# Patient Record
Sex: Female | Born: 1955 | Race: White | Hispanic: No | Marital: Married | Smoking: Former smoker
Health system: Southern US, Community
[De-identification: ages and names within clinical notes are randomized; demographics above are authoritative.]

## PROBLEM LIST (undated history)

## (undated) DIAGNOSIS — I253 Aneurysm of heart: Secondary | ICD-10-CM

## (undated) DIAGNOSIS — F410 Panic disorder [episodic paroxysmal anxiety] without agoraphobia: Secondary | ICD-10-CM

## (undated) DIAGNOSIS — G510 Bell's palsy: Secondary | ICD-10-CM

## (undated) DIAGNOSIS — K219 Gastro-esophageal reflux disease without esophagitis: Secondary | ICD-10-CM

## (undated) DIAGNOSIS — M8580 Other specified disorders of bone density and structure, unspecified site: Secondary | ICD-10-CM

## (undated) DIAGNOSIS — Z800 Family history of malignant neoplasm of digestive organs: Secondary | ICD-10-CM

## (undated) DIAGNOSIS — E785 Hyperlipidemia, unspecified: Secondary | ICD-10-CM

## (undated) DIAGNOSIS — R739 Hyperglycemia, unspecified: Secondary | ICD-10-CM

## (undated) HISTORY — DX: Hyperglycemia, unspecified: R73.9

## (undated) HISTORY — DX: Hyperlipidemia, unspecified: E78.5

## (undated) HISTORY — PX: BREAST BIOPSY: SHX20

## (undated) HISTORY — DX: Family history of malignant neoplasm of digestive organs: Z80.0

## (undated) HISTORY — DX: Panic disorder (episodic paroxysmal anxiety): F41.0

## (undated) HISTORY — DX: Other specified disorders of bone density and structure, unspecified site: M85.80

## (undated) HISTORY — DX: Bell's palsy: G51.0

## (undated) HISTORY — DX: Aneurysm of heart: I25.3

## (undated) HISTORY — DX: Gastro-esophageal reflux disease without esophagitis: K21.9

---

## 2003-08-17 ENCOUNTER — Encounter: Payer: Self-pay | Admitting: Internal Medicine

## 2004-08-25 ENCOUNTER — Ambulatory Visit: Payer: Self-pay | Admitting: Specialist

## 2004-08-31 ENCOUNTER — Ambulatory Visit: Payer: Self-pay | Admitting: Specialist

## 2004-09-13 ENCOUNTER — Ambulatory Visit: Payer: Self-pay | Admitting: Family Medicine

## 2004-10-16 ENCOUNTER — Ambulatory Visit: Payer: Self-pay | Admitting: Family Medicine

## 2004-11-14 ENCOUNTER — Other Ambulatory Visit: Payer: Self-pay

## 2004-11-17 ENCOUNTER — Ambulatory Visit: Payer: Self-pay | Admitting: Specialist

## 2004-12-19 ENCOUNTER — Ambulatory Visit: Payer: Self-pay | Admitting: Family Medicine

## 2005-01-31 ENCOUNTER — Ambulatory Visit: Payer: Self-pay | Admitting: Family Medicine

## 2005-03-07 ENCOUNTER — Ambulatory Visit: Payer: Self-pay | Admitting: Family Medicine

## 2005-04-09 ENCOUNTER — Ambulatory Visit: Payer: Self-pay | Admitting: Internal Medicine

## 2005-04-26 ENCOUNTER — Ambulatory Visit: Payer: Self-pay | Admitting: Family Medicine

## 2005-06-27 ENCOUNTER — Ambulatory Visit: Payer: Self-pay | Admitting: Family Medicine

## 2005-07-31 ENCOUNTER — Ambulatory Visit: Payer: Self-pay | Admitting: Family Medicine

## 2005-08-07 ENCOUNTER — Ambulatory Visit: Payer: Self-pay | Admitting: Family Medicine

## 2005-08-24 ENCOUNTER — Ambulatory Visit: Payer: Self-pay | Admitting: Family Medicine

## 2005-09-17 ENCOUNTER — Other Ambulatory Visit: Payer: Self-pay

## 2005-09-18 ENCOUNTER — Observation Stay: Payer: Self-pay | Admitting: Internal Medicine

## 2005-09-25 ENCOUNTER — Encounter: Payer: Self-pay | Admitting: Family Medicine

## 2005-09-25 LAB — CONVERTED CEMR LAB

## 2005-10-05 ENCOUNTER — Ambulatory Visit: Payer: Self-pay | Admitting: Family Medicine

## 2005-10-25 ENCOUNTER — Ambulatory Visit: Payer: Self-pay | Admitting: Family Medicine

## 2006-06-04 ENCOUNTER — Ambulatory Visit: Payer: Self-pay | Admitting: Family Medicine

## 2006-06-19 ENCOUNTER — Ambulatory Visit (HOSPITAL_COMMUNITY): Admission: RE | Admit: 2006-06-19 | Discharge: 2006-06-19 | Payer: Self-pay | Admitting: Urology

## 2006-07-12 ENCOUNTER — Ambulatory Visit: Payer: Self-pay | Admitting: Internal Medicine

## 2006-12-02 ENCOUNTER — Ambulatory Visit: Payer: Self-pay | Admitting: Family Medicine

## 2006-12-03 ENCOUNTER — Ambulatory Visit: Payer: Self-pay | Admitting: Family Medicine

## 2006-12-03 LAB — CONVERTED CEMR LAB
BUN: 13 mg/dL (ref 6–23)
Basophils Relative: 0.2 % (ref 0.0–1.0)
Bilirubin, Direct: 0.2 mg/dL (ref 0.0–0.3)
CO2: 31 meq/L (ref 19–32)
Eosinophils Absolute: 0.1 10*3/uL (ref 0.0–0.6)
Eosinophils Relative: 1.1 % (ref 0.0–5.0)
GFR calc Af Amer: 113 mL/min
Glucose, Bld: 107 mg/dL — ABNORMAL HIGH (ref 70–99)
HDL: 51.5 mg/dL (ref >39.0)
Hemoglobin: 14.4 g/dL (ref 12.0–15.0)
Lymphocytes Relative: 8.3 % — ABNORMAL LOW (ref 12.0–46.0)
MCV: 93.1 fL (ref 78.0–100.0)
Monocytes Absolute: 0.6 10*3/uL (ref 0.2–0.7)
Monocytes Relative: 7.1 % (ref 3.0–11.0)
Neutro Abs: 7.6 10*3/uL (ref 1.4–7.7)
Potassium: 4.2 meq/L (ref 3.5–5.1)
TSH: 0.9 microintl units/mL (ref 0.35–5.50)
Total Protein: 6.7 g/dL (ref 6.0–8.3)
VLDL: 24 mg/dL (ref 0–40)

## 2007-01-13 LAB — HM DEXA SCAN

## 2007-01-23 ENCOUNTER — Ambulatory Visit: Payer: Self-pay | Admitting: Family Medicine

## 2007-01-23 DIAGNOSIS — J309 Allergic rhinitis, unspecified: Secondary | ICD-10-CM | POA: Insufficient documentation

## 2007-01-23 DIAGNOSIS — E785 Hyperlipidemia, unspecified: Secondary | ICD-10-CM | POA: Insufficient documentation

## 2007-01-23 DIAGNOSIS — N390 Urinary tract infection, site not specified: Secondary | ICD-10-CM | POA: Insufficient documentation

## 2007-03-11 ENCOUNTER — Ambulatory Visit: Payer: Self-pay | Admitting: Internal Medicine

## 2007-04-07 ENCOUNTER — Telehealth: Payer: Self-pay | Admitting: Family Medicine

## 2007-05-20 ENCOUNTER — Ambulatory Visit: Payer: Self-pay | Admitting: Internal Medicine

## 2007-06-09 ENCOUNTER — Ambulatory Visit (HOSPITAL_COMMUNITY): Admission: RE | Admit: 2007-06-09 | Discharge: 2007-06-09 | Payer: Self-pay | Admitting: Internal Medicine

## 2007-07-25 ENCOUNTER — Ambulatory Visit: Payer: Self-pay | Admitting: Family Medicine

## 2007-08-08 ENCOUNTER — Telehealth: Payer: Self-pay | Admitting: Family Medicine

## 2007-08-25 ENCOUNTER — Encounter: Payer: Self-pay | Admitting: Family Medicine

## 2007-12-10 ENCOUNTER — Telehealth: Payer: Self-pay | Admitting: Family Medicine

## 2007-12-17 LAB — HM COLONOSCOPY: HM Colonoscopy: NORMAL

## 2007-12-24 ENCOUNTER — Ambulatory Visit: Payer: Self-pay | Admitting: Family Medicine

## 2008-01-26 ENCOUNTER — Encounter: Payer: Self-pay | Admitting: Family Medicine

## 2008-02-24 ENCOUNTER — Ambulatory Visit: Payer: Self-pay | Admitting: Family Medicine

## 2008-02-24 DIAGNOSIS — K219 Gastro-esophageal reflux disease without esophagitis: Secondary | ICD-10-CM | POA: Insufficient documentation

## 2008-04-08 DIAGNOSIS — F410 Panic disorder [episodic paroxysmal anxiety] without agoraphobia: Secondary | ICD-10-CM | POA: Insufficient documentation

## 2008-04-08 DIAGNOSIS — Z8742 Personal history of other diseases of the female genital tract: Secondary | ICD-10-CM | POA: Insufficient documentation

## 2008-04-13 ENCOUNTER — Ambulatory Visit: Payer: Self-pay | Admitting: Internal Medicine

## 2008-04-29 ENCOUNTER — Encounter: Payer: Self-pay | Admitting: Internal Medicine

## 2008-04-29 ENCOUNTER — Ambulatory Visit: Payer: Self-pay | Admitting: Internal Medicine

## 2008-05-11 ENCOUNTER — Encounter: Payer: Self-pay | Admitting: Internal Medicine

## 2008-06-29 ENCOUNTER — Encounter: Payer: Self-pay | Admitting: Cardiovascular Disease

## 2008-06-29 ENCOUNTER — Encounter: Payer: Self-pay | Admitting: Family Medicine

## 2008-07-02 ENCOUNTER — Encounter: Payer: Self-pay | Admitting: Cardiovascular Disease

## 2008-07-05 ENCOUNTER — Encounter: Payer: Self-pay | Admitting: Cardiovascular Disease

## 2008-07-13 ENCOUNTER — Encounter: Payer: Self-pay | Admitting: Family Medicine

## 2008-07-27 ENCOUNTER — Ambulatory Visit: Payer: Self-pay | Admitting: Internal Medicine

## 2008-08-10 ENCOUNTER — Ambulatory Visit: Payer: Self-pay | Admitting: Internal Medicine

## 2008-08-24 LAB — CONVERTED CEMR LAB: Pap Smear: NORMAL

## 2008-09-08 ENCOUNTER — Ambulatory Visit: Payer: Self-pay | Admitting: Family Medicine

## 2008-09-20 ENCOUNTER — Ambulatory Visit: Payer: Self-pay | Admitting: Family Medicine

## 2008-09-20 DIAGNOSIS — E559 Vitamin D deficiency, unspecified: Secondary | ICD-10-CM | POA: Insufficient documentation

## 2008-09-21 LAB — CONVERTED CEMR LAB
Albumin: 3.7 g/dL (ref 3.5–5.2)
CO2: 30 meq/L (ref 19–32)
Calcium: 9.2 mg/dL (ref 8.4–10.5)
Chloride: 104 meq/L (ref 96–112)
GFR calc non Af Amer: 111 mL/min
Potassium: 4.4 meq/L (ref 3.5–5.1)
Sodium: 141 meq/L (ref 135–145)
TSH: 0.78 microintl units/mL (ref 0.35–5.50)

## 2008-09-22 ENCOUNTER — Ambulatory Visit: Payer: Self-pay | Admitting: Family Medicine

## 2008-09-28 DIAGNOSIS — Z8632 Personal history of gestational diabetes: Secondary | ICD-10-CM | POA: Insufficient documentation

## 2008-09-28 LAB — CONVERTED CEMR LAB: Hgb A1c MFr Bld: 6.3 % — ABNORMAL HIGH (ref 4.6–6.0)

## 2008-10-04 ENCOUNTER — Encounter: Admission: RE | Admit: 2008-10-04 | Discharge: 2008-10-04 | Payer: Self-pay | Admitting: Family Medicine

## 2008-11-25 ENCOUNTER — Ambulatory Visit: Payer: Self-pay | Admitting: Unknown Physician Specialty

## 2009-04-25 ENCOUNTER — Ambulatory Visit: Payer: Self-pay | Admitting: Family Medicine

## 2009-04-26 LAB — CONVERTED CEMR LAB
BUN: 15 mg/dL (ref 6–23)
CO2: 32 meq/L (ref 19–32)
Chloride: 108 meq/L (ref 96–112)
Cholesterol: 155 mg/dL (ref 0–200)
Hgb A1c MFr Bld: 5.9 % (ref 4.6–6.5)
Total CHOL/HDL Ratio: 3
Triglycerides: 64 mg/dL (ref 0.0–149.0)

## 2009-07-06 ENCOUNTER — Telehealth: Payer: Self-pay | Admitting: Family Medicine

## 2009-07-22 ENCOUNTER — Encounter (INDEPENDENT_AMBULATORY_CARE_PROVIDER_SITE_OTHER): Payer: Self-pay | Admitting: *Deleted

## 2009-10-25 ENCOUNTER — Encounter (INDEPENDENT_AMBULATORY_CARE_PROVIDER_SITE_OTHER): Payer: Self-pay | Admitting: *Deleted

## 2009-11-01 ENCOUNTER — Ambulatory Visit: Payer: Self-pay | Admitting: Family Medicine

## 2009-11-03 LAB — CONVERTED CEMR LAB
CO2: 31 meq/L (ref 19–32)
Calcium: 9.2 mg/dL (ref 8.4–10.5)
Cholesterol: 139 mg/dL (ref 0–200)
Creatinine, Ser: 0.7 mg/dL (ref 0.4–1.2)
Creatinine,U: 104.4 mg/dL
Hgb A1c MFr Bld: 5.9 % (ref 4.6–6.5)
LDL Cholesterol: 70 mg/dL (ref 0–99)
Microalb Creat Ratio: 13.4 mg/g (ref 0.0–30.0)
Microalb, Ur: 1.4 mg/dL (ref 0.0–1.9)
Potassium: 4.2 meq/L (ref 3.5–5.1)
Sodium: 143 meq/L (ref 135–145)
Triglycerides: 85 mg/dL (ref 0.0–149.0)

## 2009-12-16 LAB — HM PAP SMEAR: HM Pap smear: NORMAL

## 2010-01-02 ENCOUNTER — Telehealth: Payer: Self-pay | Admitting: Family Medicine

## 2010-01-05 ENCOUNTER — Ambulatory Visit: Payer: Self-pay | Admitting: Family Medicine

## 2010-01-05 LAB — CONVERTED CEMR LAB
Bilirubin Urine: NEGATIVE
Specific Gravity, Urine: 1.025
Urobilinogen, UA: 0.2
pH: 7

## 2010-01-12 LAB — HM DEXA SCAN

## 2010-05-01 ENCOUNTER — Encounter: Payer: Self-pay | Admitting: Family Medicine

## 2010-05-02 ENCOUNTER — Ambulatory Visit: Payer: Self-pay | Admitting: Family Medicine

## 2010-05-04 LAB — CONVERTED CEMR LAB
AST: 22 units/L (ref 0–37)
Albumin: 4.1 g/dL (ref 3.5–5.2)
Alkaline Phosphatase: 65 units/L (ref 39–117)
BUN: 17 mg/dL (ref 6–23)
Calcium: 9.3 mg/dL (ref 8.4–10.5)
Direct LDL: 148.6 mg/dL
Eosinophils Relative: 2.9 % (ref 0.0–5.0)
GFR calc non Af Amer: 82.24 mL/min (ref >60)
HCT: 41 % (ref 36.0–46.0)
HDL: 52.1 mg/dL (ref >39.00)
Hemoglobin: 13.9 g/dL (ref 12.0–15.0)
Hgb A1c MFr Bld: 6 % (ref 4.6–6.5)
Lymphocytes Relative: 31.2 % (ref 12.0–46.0)
Lymphs Abs: 1.8 10*3/uL (ref 0.7–4.0)
Monocytes Relative: 8 % (ref 3.0–12.0)
Platelets: 306 10*3/uL (ref 150.0–400.0)
Potassium: 4.2 meq/L (ref 3.5–5.1)
Sodium: 139 meq/L (ref 135–145)
Total Bilirubin: 0.7 mg/dL (ref 0.3–1.2)
Total Protein: 6.5 g/dL (ref 6.0–8.3)
VLDL: 18.4 mg/dL (ref 0.0–40.0)
WBC: 5.7 10*3/uL (ref 4.5–10.5)

## 2010-05-08 ENCOUNTER — Ambulatory Visit: Payer: Self-pay | Admitting: Family Medicine

## 2010-08-15 ENCOUNTER — Ambulatory Visit: Payer: Self-pay | Admitting: Family Medicine

## 2010-08-15 LAB — CONVERTED CEMR LAB
ALT: 15 units/L (ref 0–35)
AST: 22 units/L (ref 0–37)
Direct LDL: 135.5 mg/dL

## 2010-08-24 ENCOUNTER — Ambulatory Visit: Payer: Self-pay | Admitting: Family Medicine

## 2010-09-13 ENCOUNTER — Telehealth: Payer: Self-pay | Admitting: Family Medicine

## 2010-10-12 ENCOUNTER — Ambulatory Visit
Admission: RE | Admit: 2010-10-12 | Discharge: 2010-10-12 | Payer: Self-pay | Source: Home / Self Care | Attending: Family Medicine | Admitting: Family Medicine

## 2010-10-12 ENCOUNTER — Other Ambulatory Visit: Payer: Self-pay | Admitting: Family Medicine

## 2010-10-12 ENCOUNTER — Encounter: Payer: Self-pay | Admitting: Family Medicine

## 2010-10-12 DIAGNOSIS — R109 Unspecified abdominal pain: Secondary | ICD-10-CM | POA: Insufficient documentation

## 2010-10-12 DIAGNOSIS — Z87442 Personal history of urinary calculi: Secondary | ICD-10-CM | POA: Insufficient documentation

## 2010-10-12 DIAGNOSIS — R1084 Generalized abdominal pain: Secondary | ICD-10-CM | POA: Insufficient documentation

## 2010-10-12 LAB — CONVERTED CEMR LAB
Casts: 0 /lpf
Glucose, Urine, Semiquant: NEGATIVE
Specific Gravity, Urine: <1.005
Yeast, UA: 0
pH: 7.5

## 2010-10-13 ENCOUNTER — Ambulatory Visit: Payer: Self-pay | Admitting: Cardiology

## 2010-10-16 ENCOUNTER — Ambulatory Visit: Admission: RE | Admit: 2010-10-16 | Payer: Self-pay | Source: Home / Self Care | Admitting: Family Medicine

## 2010-10-16 LAB — RENAL FUNCTION PANEL
BUN: 21 mg/dL (ref 6–23)
CO2: 31 mEq/L (ref 19–32)
Creatinine, Ser: 0.8 mg/dL (ref 0.4–1.2)
GFR: 80.89 mL/min (ref >60.00)
Glucose, Bld: 85 mg/dL (ref 70–99)
Phosphorus: 3.7 mg/dL (ref 2.3–4.6)
Sodium: 138 mEq/L (ref 135–145)

## 2010-10-16 LAB — CBC WITH DIFFERENTIAL/PLATELET
Basophils Absolute: 0 10*3/uL (ref 0.0–0.1)
Eosinophils Absolute: 0.1 10*3/uL (ref 0.0–0.7)
HCT: 42 % (ref 36.0–46.0)
Hemoglobin: 14.2 g/dL (ref 12.0–15.0)
Lymphs Abs: 1.5 10*3/uL (ref 0.7–4.0)
MCHC: 33.8 g/dL (ref 30.0–36.0)
MCV: 95.6 fl (ref 78.0–100.0)
Monocytes Absolute: 0.4 10*3/uL (ref 0.1–1.0)
Neutro Abs: 4.1 10*3/uL (ref 1.4–7.7)
Platelets: 336 10*3/uL (ref 150.0–400.0)
RDW: 12.6 % (ref 11.5–14.6)

## 2010-10-16 LAB — HEPATIC FUNCTION PANEL
ALT: 15 U/L (ref 0–35)
Albumin: 4.1 g/dL (ref 3.5–5.2)
Bilirubin, Direct: 0.1 mg/dL (ref 0.0–0.3)
Total Protein: 6.5 g/dL (ref 6.0–8.3)

## 2010-10-16 LAB — AMYLASE: Amylase: 93 U/L (ref 27–131)

## 2010-10-24 NOTE — Letter (Signed)
Summary: [facility]  [facility]   Imported By: Maryln Gottron 05/05/2010 15:49:25  _____________________________________________________________________  External Attachment:    Type:   Image     Comment:   External Document

## 2010-10-24 NOTE — Assessment & Plan Note (Signed)
Summary: RO 30 MINS CYD   Vital Signs:  Patient profile:   55 year old female Weight:      126.50 pounds BMI:     21.13 Temp:     97.9 degrees F oral Pulse rate:   84 / minute Pulse rhythm:   regular BP sitting:   100 / 60  (left arm) Cuff size:   regular  Vitals Entered By: Sydell Axon LPN (May 08, 2010 9:34 AM) CC: Follow-up on lab work   History of Present Illness: here for f/u of lipids, hyperglycemia   is having a good summer   wt is down 1 lb-- wt is good  is still exercising - is walking and using exercise ball    bp is good 100/60  lipids are high with LDL 148 off of the lipitor (prev 70) she just wanted to stop it and see what happens  did not have side effects or problems  is eating fairly well - but slacked off a bit and wants to try to be better   hyperglycemia - fasting sugar 92 and AIC up to 6.0 was 5.8    Allergies: 1)  ! Sulfadiazine (Sulfadiazine)  Past History:  Past Medical History: Last updated: 04/25/2009 atrial septal aneurysm, patent foramen ovale frequent utis  DIABETES MELLITUS, GESTATIONAL, HX OF (ICD-V13.29) PANIC DISORDER (ICD-300.01) GERD (ICD-530.81) COLORECTAL CANCER, FAMILY HX (ICD-V16.0) FACIAL PARESTHESIA (ICD-782.0) BACK PAIN (ICD-724.5) * TONSILLAR DEBRis ALLERGIC RHINITIS (ICD-477.9) URINARY TRACT INFECTION, RECURRENT (ICD-599.0) HYPERLIPIDEMIA (ICD-272.4) osteopenia hyperglycemia (pt has hx of gestational DM)  GYN-- [facility] -- Mariah Milling  Past Surgical History: Last updated: 10/06/2008 Geatational  diabetes Dexa- osteopenia 10/05 Colonoscopy negative next 5 years 2004 (11/09) colonoscopy negative - re check 5 y stress test 4/09 abd [place] normal (01/10)  c-section x 2 3 breast biopsies  Family History: Last updated: 04/25/2009 cousin -- colon cancer  parents - diabetes   Family History of Colon Cancer: Father,cousin Family History of Pancreatic Cancer: Mother Family History of Heart Disease:  Father Family History of Diabetes: Mother,Father  Social History: Last updated: 04/08/2008 Former Smoker-stopped after college Occupation: Retired from teaching Alcohol Use - no Illicit Drug Use - no  Risk Factors: Smoking Status: quit (01/23/2007)  Review of Systems General:  Denies fatigue, loss of appetite, and malaise. Eyes:  Denies blurring and eye irritation. CV:  Denies chest pain or discomfort and palpitations. Resp:  Denies cough, shortness of breath, and wheezing. GI:  Denies abdominal pain, change in bowel habits, and indigestion. GU:  Denies dysuria and urinary frequency. MS:  Denies muscle aches and cramps. Derm:  Denies itching, poor wound healing, and rash. Neuro:  Denies numbness and tingling. Endo:  Denies cold intolerance, excessive thirst, excessive urination, and heat intolerance. Heme:  Denies abnormal bruising and bleeding.  Physical Exam  General:  alert, well-developed, well-nourished, and well-hydrated.   Head:  normocephalic, atraumatic, and no abnormalities observed.   Eyes:  vision grossly intact, pupils equal, pupils round, and pupils reactive to light.  no conjunctival pallor, injection or icterus  Mouth:  MMM Neck:  supple with full rom and no masses or thyromegally, no JVD or carotid bruit  Lungs:  Normal respiratory effort, chest expands symmetrically. Lungs are clear to auscultation, no crackles or wheezes. Heart:  2/6 systolic murmur , RRR  Abdomen:  Bowel sounds positive,abdomen soft and non-tender without masses, organomegaly or hernias noted. no renal bruits  Msk:  No deformity or scoliosis noted of thoracic or lumbar  spine.  no acute joint changes  Pulses:  R and L carotid,radial,femoral,dorsalis pedis and posterior tibial pulses are full and equal bilaterally Extremities:  No clubbing, cyanosis, edema, or deformity noted with normal full range of motion of all joints.   Neurologic:  sensation intact to light touch, gait normal, and DTRs  symmetrical and normal.   Skin:  Intact without suspicious lesions or rashes Cervical Nodes:  No lymphadenopathy noted Inguinal Nodes:  No significant adenopathy Psych:  normal affect, talkative and pleasant    Impression & Recommendations:  Problem # 1:  HYPERGLYCEMIA (ICD-790.29) Assessment Deteriorated  with AIc of 6.0-- higher than prev disc healthy diet (low simple sugar/ choose complex carbs/ low sat fat) diet and exercise in detail  long disc about low glycemic index diet  ? 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 One by mouth daily 2)  Aspirin 81 Mg Tabs (Aspirin) .... Take one by mouth daily 3)  Multivitamins Tabs (Multiple vitamin) .... Take one by mouth daily 4)  Vitamin D 50,0000 Iu --------(from [place])  .Marland Kitchen.. 1 by mouth weekly 5)  Valtrex 1 Gm Tabs (Valacyclovir hcl) .... 2 by mouth two times a day for one day for cold sores  Patient Instructions: 1)  you can raise your HDL (good cholesterol) by increasing exercise and eating omega 3 fatty acid supplement like fish oil or flax seed oil over the counter 2)  you can lower LDL (bad cholesterol) by limiting saturated fats in diet like red meat, fried foods, egg yolks, fatty breakfast meats, high fat dairy products and shellfish  3)  also watch your sugar - keep carbs complex with 2-3 oz servings 4)  keep up the exercise 5)  schedule fasting labs and then f/u in 3 months  lipid/ast/alt/AIC 272, hyperglycemia   Current Allergies (reviewed today): ! SULFADIAZINE (SULFADIAZINE)   Preventive Care Screening  Mammogram:    Date:  12/23/2009    Results:  normal   Pap Smear:    Date:  12/23/2009    Results:  normal   Appended Document: F/U AFTER LABS / LFW r/s from 11/29 please let pt know that I did some research on pancreatic cancer in families -- and there may be a mild genetic component - I know she is worried because she has a family hx if she wants further eval - the literature recommends spiral CT of the area  if she is interested in this ask her to call insurance to make sure that would be covered for screening of panc ca with family hx -- then let me know and I will schedule test   Appended Document: F/U AFTER LABS / LFW r/s from 11/29 called [phone] got no answer and no v/m will try another time.

## 2010-10-24 NOTE — Assessment & Plan Note (Signed)
Summary: ?UTI/CLE   Vital Signs:  Patient profile:   55 year old female Weight:      127.75 pounds Temp:     98.1 degrees F oral Pulse rate:   68 / minute Pulse rhythm:   regular BP sitting:   120 / 78  (left arm) Cuff size:   regular  Vitals Entered By: Sydell Axon LPN (January 05, 2010 2:25 PM) CC: ? UTI, urine frequency and urgency   History of Present Illness: Pt here for frequent urination which has been going on for one week, has strong odor and alsways has sens of urgency. She has not had fever or chills, no back pain.   Problems Prior to Update: 1)  Hyperglycemia  (ICD-790.29) 2)  Neoplasm, Malignant, Pancreas, Family Hx  (ICD-V16.0) 3)  Unspecified Vitamin D Deficiency  (ICD-268.9) 4)  Diabetes Mellitus, Gestational, Hx of  (ICD-V13.29) 5)  Panic Disorder  (ICD-300.01) 6)  Gerd  (ICD-530.81) 7)  Colorectal Cancer, Family Hx  (ICD-V16.0) 8)  Facial Paresthesia  (ICD-782.0) 9)  Back Pain  (ICD-724.5) 10)  Tonsillar Debris  () 11)  Allergic Rhinitis  (ICD-477.9) 12)  Urinary Tract Infection, Recurrent  (ICD-599.0) 13)  Hyperlipidemia  (ICD-272.4)  Medications Prior to Update: 1)  Citracal/vitamin D 250-200 Mg-Unit Tabs (Calcium Citrate-Vitamin D) .... One By Mouth Daily 2)  Aspirin 81 Mg Tabs (Aspirin) .... Take One By Mouth Daily 3)  Multivitamins  Tabs (Multiple Vitamin) .... Take One By Mouth Daily 4)  Vitamin D 50,0000 Iu  --------(From [facility]) .Marland Kitchen.. 1 By Mouth Weekly 5)  Valtrex 1 Gm Tabs (Valacyclovir Hcl) .... 2 By Mouth Two Times A Day For One Day For Cold Sores  Allergies: 1)  ! Sulfadiazine (Sulfadiazine)  Physical Exam  General:  alert, well-developed, well-nourished, and well-hydrated.   Head:  normocephalic, atraumatic, and no abnormalities observed.   Eyes:  vision grossly intact, pupils equal, pupils round, and pupils reactive to light.  no conjunctival pallor, injection or icterus  Abdomen:  No suprapubic tenderness Msk:  No CVAT   Impression &  Recommendations:  Problem # 1:  URINARY TRACT INFECTION, RECURRENT (ICD-599.0) Assessment Unchanged  Many bacteria seen, minimal WBCs seen. Will treat and culture. Her updated medication list for this problem includes:    Macrobid 100 Mg Caps (Nitrofurantoin monohyd macro) ..... One tab by mouth two times a day 14  Orders: T-Culture, Urine ([id number])  Complete Medication List: 1)  Citracal/vitamin D 250-200 Mg-unit Tabs (Calcium citrate-vitamin d) .... One by mouth daily 2)  Aspirin 81 Mg Tabs (Aspirin) .... Take one by mouth daily 3)  Multivitamins Tabs (Multiple vitamin) .... Take one by mouth daily 4)  Vitamin D 50,0000 Iu --------(from [place])  .Marland Kitchen.. 1 by mouth weekly 5)  Valtrex 1 Gm Tabs (Valacyclovir hcl) .... 2 by mouth two times a day for one day for cold sores 6)  Macrobid 100 Mg Caps (Nitrofurantoin monohyd macro) .... One tab by mouth two times a day 14  Other Orders: UA Dipstick W/ Micro (manual) ([id number])  Patient Instructions: 1)  Will call with culture results. Prescriptions: MACROBID 100 MG CAPS (NITROFURANTOIN MONOHYD MACRO) one tab by mouth two times a day 14  #14 x 0   Entered and Authorized by:   Shaune Leeks MD   Signed by:   Shaune Leeks MD on 01/05/2010   Method used:   Electronically to        [facility] [phone]* (retail)  [address]       [place], [place]  [address]       Ph: [phone] or [phone]       Fax: [phone]   RxID:   [id number]   Current Allergies (reviewed today): ! SULFADIAZINE (SULFADIAZINE)  Laboratory Results   Urine Tests  Date/Time Received: January 05, 2010 2:35 PM  Date/Time Reported: January 05, 2010 2:35 PM   Routine Urinalysis   Color: lt. yellow Appearance: Cloudy Glucose: negative   (Normal Range: Negative) Bilirubin: negative   (Normal Range: Negative) Ketone: negative   (Normal Range: Negative) Spec. Gravity: 1.025   (Normal Range: 1.003-1.035) Blood:  negative   (Normal Range: Negative) pH: 7.0   (Normal Range: 5.0-8.0) Protein: trace   (Normal Range: Negative) Urobilinogen: 0.2   (Normal Range: 0-1) Nitrite: negative   (Normal Range: Negative) Leukocyte Esterace: moderate   (Normal Range: Negative)  Urine Microscopic WBC/HPF: 0-1 RBC/HPF: 0-1 Bacteria/HPF: 4+ Epithelial/HPF: Rare        Appended Document: ?UTI/CLE

## 2010-10-24 NOTE — Letter (Signed)
Summary: [place] No Show Letter  [place] [facility]  [address] [place], [place] [address]   Phone: [phone]  Fax: [phone]    10/25/2009 MRN: [id number]  [facility] [address] [place], [place]  [address]   Dear Ms. [facility],   Our records indicate that you missed your scheduled appointment with ____lab_________________ on __1.31.11__________.  Please contact this office to reschedule your appointment as soon as possible.  It is important that you keep your scheduled appointments with your physician, so we can provide you the best care possible.  Please be advised that there may be a charge for "no show" appointments.    Sincerely,   [place] at [facility]

## 2010-10-24 NOTE — Progress Notes (Signed)
Summary: Wants to stop Lipitor  Phone Note Call from Patient Call back at Home Phone [phone]   Caller: Patient Call For: Judith Part MD Summary of Call: Patient came in the office today and wanted to let Dr. Milinda Antis know that she wants to stop taking Lipitor.  She says that she knows she is not suppose to stop taking it suddenly but wants to "wean off" of it.  She has an appt for labs in August followed by an appt with Dr. Milinda Antis.  Please advise.  [facility]. Initial call taken by: Linde Gillis CMA Duncan Dull),  January 02, 2010 3:00 PM  Follow-up for Phone Call        does not have to wean off - just stop it  let me know why she is stopping it please we will disc in more detail in june  eat low sat fat diet  I will remove it from med list  Follow-up by: Judith Part MD,  January 02, 2010 3:07 PM  Additional Follow-up for Phone Call Additional follow up Details #1::        Patient advised.Consuello Masse CMA   Patient is going to stop medication Additional Follow-up by: Benny Lennert CMA Duncan Dull),  January 02, 2010 4:44 PM

## 2010-10-26 NOTE — Assessment & Plan Note (Signed)
Summary: F/U AFTER LABS / LFW r/s from 11/29   Vital Signs:  Patient profile:   55 year old female Height:      65 inches Weight:      116.50 pounds BMI:     19.46 Temp:     97.7 degrees F oral Pulse rate:   76 / minute Pulse rhythm:   regular BP sitting:   118 / 72  (left arm) Cuff size:   regular  Vitals Entered By: Lewanda Rife LPN (August 24, 2010 8:11 AM) CC: three month f/u after labs   History of Present Illness: here for 3 mo f/u for hyperglycemia and hyperlipidemia   pt had gest DM in past   lost 10 lb since last visit  is still walking about the same amount  cut out all dessert type sweets  does eat some whole wheat pasta -- otherwise not a big carb eater   bp great at 118/72  AIc is 6.1 - overall stable from last check   (did have a week vacation before [facility] )    lipids are down some with trig 68 and HDL 53 and LDL 135.5 (down from 148) is making progress  has given dessert  has given up fried foods  does not eat beef  some ice cream occas-- trying to stay away (usually frozen yogurt)  no bkfast meat or egg yolks as a rule  did eat a lot of shrimp on vacation   DM and chol in family-- father had CAD   is worried about pancreatic cancer because mother had it  had nl [place] in 2010     Allergies: 1)  ! Sulfadiazine (Sulfadiazine)  Past History:  Past Medical History: Last updated: 04/25/2009 atrial septal aneurysm, patent foramen ovale frequent utis  DIABETES MELLITUS, GESTATIONAL, HX OF (ICD-V13.29) PANIC DISORDER (ICD-300.01) GERD (ICD-530.81) COLORECTAL CANCER, FAMILY HX (ICD-V16.0) FACIAL PARESTHESIA (ICD-782.0) BACK PAIN (ICD-724.5) * TONSILLAR DEBRis ALLERGIC RHINITIS (ICD-477.9) URINARY TRACT INFECTION, RECURRENT (ICD-599.0) HYPERLIPIDEMIA (ICD-272.4) osteopenia hyperglycemia (pt has hx of gestational DM)  GYN-- [facility] -- Mariah Milling  Past Surgical History: Last updated: 10/06/2008 Geatational  diabetes Dexa-  osteopenia 10/05 Colonoscopy negative next 5 years 2004 (11/09) colonoscopy negative - re check 5 y stress test 4/09 abd [place] normal (01/10)  c-section x 2 3 breast biopsies  Family History: Last updated: 04/25/2009 cousin -- colon cancer  parents - diabetes   Family History of Colon Cancer: Father,cousin Family History of Pancreatic Cancer: Mother Family History of Heart Disease: Father Family History of Diabetes: Mother,Father  Social History: Last updated: 04/08/2008 Former Smoker-stopped after college Occupation: Retired from teaching Alcohol Use - no Illicit Drug Use - no  Risk Factors: Smoking Status: quit (01/23/2007)  Review of Systems General:  Denies fatigue, loss of appetite, and malaise. Eyes:  Denies blurring and eye pain. CV:  Denies palpitations and shortness of breath with exertion. Resp:  Denies cough, shortness of breath, and wheezing. GI:  Denies abdominal pain, change in bowel habits, and indigestion. GU:  Denies dysuria and urinary frequency. MS:  Denies joint pain, muscle aches, and cramps. Derm:  Denies itching, lesion(s), poor wound healing, and rash. Neuro:  Denies numbness and weakness. Psych:  Denies anxiety and depression. Endo:  Denies excessive thirst and excessive urination. Heme:  Denies abnormal bruising and bleeding.  Physical Exam  General:  slim and well appearing  Head:  normocephalic, atraumatic, and no abnormalities observed.   Eyes:  vision grossly intact,  pupils equal, pupils round, and pupils reactive to light.  no conjunctival pallor, injection or icterus  Mouth:  pharynx pink and moist.   Neck:  supple with full rom and no masses or thyromegally, no JVD or carotid bruit  Lungs:  Normal respiratory effort, chest expands symmetrically. Lungs are clear to auscultation, no crackles or wheezes. Heart:  2/6 systolic murmur , RRR  Abdomen:  Bowel sounds positive,abdomen soft and non-tender without masses, organomegaly or hernias  noted. no renal bruits  Msk:  No deformity or scoliosis noted of thoracic or lumbar spine.   Pulses:  R and L carotid,radial,femoral,dorsalis pedis and posterior tibial pulses are full and equal bilaterally Extremities:  No clubbing, cyanosis, edema, or deformity noted with normal full range of motion of all joints.   Neurologic:  sensation intact to light touch, gait normal, and DTRs symmetrical and normal.   Skin:  Intact without suspicious lesions or rashes Cervical Nodes:  No lymphadenopathy noted Psych:  normal affect, talkative and pleasant    Impression & Recommendations:  Problem # 1:  HYPERGLYCEMIA (ICD-790.29) Assessment Unchanged  pt has stable AIC -- with low sugar diet and wt loss she is interested in lifestyle center - perhaps later after tax season commended on good work re check in 6 mo and f/u  Labs Reviewed: Creat: 0.8 (05/02/2010)     Last Eye Exam: normal (04/23/2009)  Problem # 2:  HYPERLIPIDEMIA (ICD-272.4) Assessment: Improved  with some improvement with low sat fat diet reviewed this again in detail  goal LDL would be 100 (though under 130 is acceptible) disc family hx  re check 6 mo and f/u  Labs Reviewed: SGOT: 22 (08/15/2010)   SGPT: 15 (08/15/2010)   HDL:53.90 (08/15/2010), 52.10 (05/02/2010)  LDL:70 (11/01/2009), 87 (04/25/2009)  Chol:213 (08/15/2010), 212 (05/02/2010)  Trig:68.0 (08/15/2010), 92.0 (05/02/2010)  Problem # 3:  NEOPLASM, MALIGNANT, PANCREAS, FAMILY HX (ICD-V16.0) pt is worried about this in light of mother's hx/ her own hyperglycemia  will look into it in more detail  she had nl [place] in 2010 which is re- assuring  pt is asking about mri  Complete Medication List: 1)  Citracal/vitamin D 250-200 Mg-unit Tabs (Calcium citrate-vitamin d) .... One by mouth daily 2)  Aspirin 81 Mg Tabs (Aspirin) .... Take one by mouth daily 3)  Multivitamins Tabs (Multiple vitamin) .... Take one by mouth daily 4)  Valtrex 1 Gm Tabs (Valacyclovir hcl)  .... 2 by mouth two times a day for one day for cold sores as needed. 5)  Vitamin D 1000 Unit Tabs (Cholecalciferol) .... Take 1 tablet by mouth once a day  Patient Instructions: 1)  you can raise your HDL (good cholesterol) by increasing exercise and eating omega 3 fatty acid supplement like fish oil or flax seed oil over the counter 2)  you can lower LDL (bad cholesterol) by limiting saturated fats in diet like red meat, fried foods, egg yolks, fatty breakfast meats, high fat dairy products and shellfish  3)  schedule fasting labs in 6 months lipid/ast/alt 272 and AIc , renal , hepatic for hyperglycemia   4)  then follow up    Orders Added: 1)  Est. Patient Level IV [95621]    Current Allergies (reviewed today): ! SULFADIAZINE (SULFADIAZINE)  Appended Document: F/U AFTER LABS / LFW r/s from 11/29 please let pt know that I did some research on pancreatic cancer in families -- and there may be a mild genetic component - I know she is  worried because she has a family hx if she wants further eval - the literature recommends spiral CT of the area  if she is interested in this ask her to call insurance to make sure that would be covered for screening of panc ca with family hx -- then let me know and I will schedule test   Appended Document: F/U AFTER LABS / LFW r/s from 11/29 called [phone] got no answer and no v/m will try another time.  Appended Document: F/U AFTER LABS / LFW r/s from 11/29 Patient notified as instructed by telephone. Pt said she would ck and let Dr Milinda Antis know.

## 2010-10-26 NOTE — Progress Notes (Signed)
Summary: wants spiral CT  Phone Note Call from Patient Call back at Home Phone [phone]   Caller: Patient Call For: [name] Conway Summary of Call: Pt has decided to have spiral CT.  She prefers to go to [facility].  She is out of town until 12/26. Initial call taken by: Lowella Petties CMA, AAMA,  September 13, 2010 10:42 AM  Follow-up for Phone Call        I will go ahead and order for [name] last renal profile was summer- let me know if we need to re order bun/ cr for contrast -thanks Follow-up by: [name] Conway,  September 13, 2010 1:15 PM  Additional Follow-up for Phone Call Additional follow up Details #1::        Called patient to schedule the CT. Patient did not call her insurance co so I recommended that she do this before she has it done as it likely wil not be Authorized by [employer]. she will call me back on 09/19/2010 after  she calls her insurance company. will wait to hear from her . Additional Follow-up by: Carlton Adam,  September 13, 2010 2:38 PM    Additional Follow-up for Phone Call Additional follow up Details #2::    Called patient again about the CT. She does not want to have it done as it will not be covered under her insurance plan. I will cancel the order .  Follow-up by: Carlton Adam,  September 28, 2010 4:26 PM

## 2010-10-26 NOTE — Assessment & Plan Note (Signed)
Summary: LOWER ABD,BACK PAIN/CLE   Vital Signs:  Patient profile:   55 year old female Weight:      115.75 pounds BMI:     19.33 Temp:     97.7 degrees F oral Pulse rate:   76 / minute Pulse rhythm:   regular BP sitting:   110 / 64  (left arm) Cuff size:   regular  Vitals Entered By: Selena Batten Dance CMA Duncan Dull) (October 12, 2010 11:54 AM) CC: Abd and back pain x 2weeks   History of Present Illness: 2 weeks of symptoms   is having pain over L outer hip - radiates to low abd  some nausea also  woke her up in the middle of the night   is dull in general -- not doubling her over no urine symptoms  no frequency or blood or burning  no fever   has been nauseated on and off   no vaginal d/c or irritation of bleeding   is worrried about a kidney stone  does have a fam hx of panc cancer  this worries her as she has also lost some wt      Allergies: 1)  ! Sulfadiazine (Sulfadiazine)  Past History:  Past Medical History: Last updated: 04/25/2009 atrial septal aneurysm, patent foramen ovale frequent utis  DIABETES MELLITUS, GESTATIONAL, HX OF (ICD-V13.29) PANIC DISORDER (ICD-300.01) GERD (ICD-530.81) COLORECTAL CANCER, FAMILY HX (ICD-V16.0) FACIAL PARESTHESIA (ICD-782.0) BACK PAIN (ICD-724.5) * TONSILLAR DEBRis ALLERGIC RHINITIS (ICD-477.9) URINARY TRACT INFECTION, RECURRENT (ICD-599.0) HYPERLIPIDEMIA (ICD-272.4) osteopenia hyperglycemia (pt has hx of gestational DM)  GYN-- [facility] -- Mariah Milling  Past Surgical History: Last updated: 10/06/2008 Geatational  diabetes Dexa- osteopenia 10/05 Colonoscopy negative next 5 years 2004 (11/09) colonoscopy negative - re check 5 y stress test 4/09 abd [place] normal (01/10)  c-section x 2 3 breast biopsies  Family History: Last updated: 04/25/2009 cousin -- colon cancer  parents - diabetes   Family History of Colon Cancer: Father,cousin Family History of Pancreatic Cancer: Mother Family History of Heart Disease:  Father Family History of Diabetes: Mother,Father  Social History: Last updated: 04/08/2008 Former Smoker-stopped after college Occupation: Retired from teaching Alcohol Use - no Illicit Drug Use - no  Risk Factors: Smoking Status: quit (01/23/2007)  Review of Systems General:  Complains of weight loss; denies fatigue, loss of appetite, and malaise. Eyes:  Denies blurring and eye irritation. CV:  Denies chest pain or discomfort, lightheadness, and palpitations. Resp:  Denies cough, shortness of breath, and wheezing. GI:  Complains of nausea; denies abdominal pain, bloody stools, change in bowel habits, and indigestion. GU:  Denies dysuria, hematuria, and urinary frequency. MS:  Complains of low back pain; denies muscle weakness. Derm:  Denies itching, lesion(s), poor wound healing, and rash. Neuro:  Denies numbness and tingling. Psych:  Denies anxiety and depression. Endo:  Denies cold intolerance, excessive thirst, excessive urination, and heat intolerance. Heme:  Denies abnormal bruising and bleeding.  Physical Exam  General:  slim and well appearing  Head:  normocephalic, atraumatic, and no abnormalities observed.   Eyes:  vision grossly intact, pupils equal, pupils round, and pupils reactive to light.  no conjunctival pallor, injection or icterus  Mouth:  pharynx pink and moist.   Neck:  supple with full rom and no masses or thyromegally, no JVD or carotid bruit  Chest Wall:  No deformities, masses, or tenderness noted. Lungs:  Normal respiratory effort, chest expands symmetrically. Lungs are clear to auscultation, no crackles or wheezes. Heart:  2/6  systolic murmur , RRR  Abdomen:  L flank tenderness some suprapubic tenderness no cva tenderness  no rebound or gaurding  no distention, no masses, no hepatomegaly, and no splenomegaly.   Msk:  no CVA tenderness  no spinal tenderness no trochanteric tenderness neg slr  nl rom hips  nl gait  Pulses:  R and L  carotid,radial,femoral,dorsalis pedis and posterior tibial pulses are full and equal bilaterally Extremities:  No clubbing, cyanosis, edema, or deformity noted with normal full range of motion of all joints.   Neurologic:  sensation intact to light touch, gait normal, and DTRs symmetrical and normal.   Skin:  Intact without suspicious lesions or rashes Cervical Nodes:  No lymphadenopathy noted Inguinal Nodes:  No significant adenopathy Psych:  normal affect, talkative and pleasant    Impression & Recommendations:  Problem # 1:  FLANK PAIN, LEFT (ICD-789.09) Assessment New with abd pain and nausea - also pos ua and hx of kidney stone on L  labs and spiral CT urine cx tx for uti with cipro and update  Her updated medication list for this problem includes:    Aspirin 81 Mg Tabs (Aspirin) .Marland Kitchen... Take one by mouth daily  Orders: UA Dipstick W/ Micro (manual) (22025) Venipuncture (42706) TLB-Renal Function Panel (80069-RENAL) TLB-CBC Platelet - w/Differential (85025-CBCD) TLB-Amylase (82150-AMYL) TLB-Lipase (83690-LIPASE) TLB-Hepatic/Liver Function Pnl (80076-HEPATIC) T-Culture, Urine (23762-83151) Radiology Referral (Radiology)  Problem # 2:  NEPHROLITHIASIS, HX OF (ICD-V13.01) Assessment: New has hx of kidney stone on L that correlates with pain  rev urol note spiral CT ordered  Orders: UA Dipstick W/ Micro (manual) (76160) Venipuncture (73710) TLB-Renal Function Panel (80069-RENAL) TLB-CBC Platelet - w/Differential (85025-CBCD) TLB-Amylase (82150-AMYL) TLB-Lipase (83690-LIPASE) TLB-Hepatic/Liver Function Pnl (80076-HEPATIC) T-Culture, Urine (62694-85462) Radiology Referral (Radiology)  Problem # 3:  ABDOMINAL PAIN, GENERALIZED (ICD-789.07) Assessment: New see above  with nausea - also some wt loss  also fam hx of panc cancer labs  ct and adv Orders: UA Dipstick W/ Micro (manual) (70350) Venipuncture (09381) TLB-Renal Function Panel (80069-RENAL) TLB-CBC Platelet  - w/Differential (85025-CBCD) TLB-Amylase (82150-AMYL) TLB-Lipase (83690-LIPASE) TLB-Hepatic/Liver Function Pnl (80076-HEPATIC) T-Culture, Urine (82993-71696) Radiology Referral (Radiology)  Complete Medication List: 1)  Citracal/vitamin D 250-200 Mg-unit Tabs (Calcium citrate-vitamin d) .... One by mouth daily 2)  Aspirin 81 Mg Tabs (Aspirin) .... Take one by mouth daily 3)  Multivitamins Tabs (Multiple vitamin) .... Take one by mouth daily 4)  Valtrex 1 Gm Tabs (Valacyclovir hcl) .... 2 by mouth two times a day for one day for cold sores as needed. 5)  Vitamin D 1000 Unit Tabs (Cholecalciferol) .... Take 1 tablet by mouth once a day 6)  Cipro 250 Mg Tabs (Ciprofloxacin hcl) .Marland Kitchen.. 1 by mouth two times a day for 5 days  Patient Instructions: 1)  continue drinking lots of water 2)  call or seek care is symptoms don't improve in 2-3 days or if you develop back pain, nausea, or vomiting 3)  take the cipro as directed 4)  labs today- will update 5)  urine culture today- will update 6)  we will do referral for CT scan at check out  Prescriptions: CIPRO 250 MG TABS (CIPROFLOXACIN HCL) 1 by mouth two times a day for 5 days  #10 x 0   Entered and Authorized by:   Judith Part MD   Signed by:   Judith Part MD on 10/12/2010   Method used:   Electronically to        [id number] (retail)       [address] [place], [place]  [address]       Ph: [phone] or [phone]       Fax: [phone]   RxID:   [id number]    Orders Added: 1)  UA Dipstick W/ Micro (manual) [81000] 2)  Venipuncture [93235] 3)  TLB-Renal Function Panel [80069-RENAL] 4)  TLB-CBC Platelet - w/Differential [85025-CBCD] 5)  TLB-Amylase [82150-AMYL] 6)  TLB-Lipase [83690-LIPASE] 7)  TLB-Hepatic/Liver Function Pnl [80076-HEPATIC] 8)  T-Culture, Urine [57322-02542] 9)  Radiology Referral [Radiology] 10)  Est. Patient Level IV [70623]    Current Allergies (reviewed  today): ! SULFADIAZINE (SULFADIAZINE)  Laboratory Results   Urine Tests  Date/Time Received: October 12, 2010 11:55 AM  Date/Time Reported: October 12, 2010 11:55 AM   Routine Urinalysis   Color: yellow Appearance: Hazy Glucose: negative   (Normal Range: Negative) Bilirubin: negative   (Normal Range: Negative) Ketone: negative   (Normal Range: Negative) Spec. Gravity: <1.005   (Normal Range: 1.003-1.035) Blood: trace-lysed   (Normal Range: Negative) pH: 7.5   (Normal Range: 5.0-8.0) Protein: negative   (Normal Range: Negative) Urobilinogen: 0.2   (Normal Range: 0-1) Nitrite: positive   (Normal Range: Negative) Leukocyte Esterace: moderate   (Normal Range: Negative)  Urine Microscopic WBC/HPF: 3-4 RBC/HPF: 1-2 Bacteria/HPF: mild Mucous/HPF: few Epithelial/HPF: 1-2 Crystals/HPF: few Casts/LPF: 0 Yeast/HPF: 0 Other: 0

## 2010-12-05 ENCOUNTER — Encounter: Payer: Self-pay | Admitting: Cardiovascular Disease

## 2010-12-12 ENCOUNTER — Encounter: Payer: Self-pay | Admitting: Cardiovascular Disease

## 2010-12-12 ENCOUNTER — Ambulatory Visit (INDEPENDENT_AMBULATORY_CARE_PROVIDER_SITE_OTHER): Payer: BC Managed Care – PPO | Admitting: Cardiovascular Disease

## 2010-12-12 DIAGNOSIS — Z0000 Encounter for general adult medical examination without abnormal findings: Secondary | ICD-10-CM

## 2010-12-12 DIAGNOSIS — Q211 Atrial septal defect: Secondary | ICD-10-CM

## 2010-12-12 DIAGNOSIS — E785 Hyperlipidemia, unspecified: Secondary | ICD-10-CM

## 2010-12-12 DIAGNOSIS — R9431 Abnormal electrocardiogram [ECG] [EKG]: Secondary | ICD-10-CM

## 2010-12-12 NOTE — Progress Notes (Signed)
   Patient ID: [name] Conway, female    DOB: April 25, 1956, 55 y.o.   MRN: [id number]  HPI [name] Conway is a very pleasant 55 year old woman with a history of hyperlipidemia, strong family history of coronary artery disease with father who had bypass surgery in his late 57s, known to me from [facility], last seen in October 2009, history of left bundle branch block, atrial septal aneurysm and PFO, abnormal stress test in December 2006 with septal abnormality also with repeat stress test in October 2009 showing septal and anteroseptal wall decreased perfusion consistent with left bundle branch block who presents to establish care.  She reports that she has been doing well with no symptoms of shortness of breath or chest discomfort. She is able to exercise without any symptoms. She has taken herself off her Lipitor as she was reading about the side effects such as possible contribution to her diabetes. Previously on Lipitor 10 mg daily her LDL was 82. Most current LDL was 135.  EKG shows normal sinus rhythm with rate 67 beats per minute left bundle branch block.   Review of Systems  Constitutional: Negative.   HENT: Negative.   Eyes: Negative.   Respiratory: Negative.   Cardiovascular: Negative.   Gastrointestinal: Negative.   Musculoskeletal: Negative.   Skin: Negative.   Neurological: Negative.   Hematological: Negative.   Psychiatric/Behavioral: Negative.       Physical Exam  Nursing note and vitals reviewed. Constitutional: She is oriented to person, place, and time. She appears well-developed and well-nourished.  HENT:  Head: Normocephalic.  Nose: Nose normal.  Mouth/Throat: Oropharynx is clear and moist.  Eyes: Conjunctivae are normal. Pupils are equal, round, and reactive to light.  Neck: Normal range of motion. Neck supple. No JVD present.  Cardiovascular: Normal rate, regular rhythm, normal heart sounds and intact distal pulses.  Exam reveals no  gallop and no friction rub.   No murmur heard. Pulmonary/Chest: Effort normal and breath sounds normal. No respiratory distress. She has no wheezes. She has no rales. She exhibits no tenderness.  Abdominal: Soft. Bowel sounds are normal. She exhibits no distension. There is no tenderness.  Musculoskeletal: Normal range of motion. She exhibits no edema and no tenderness.  Lymphadenopathy:    She has no cervical adenopathy.  Neurological: She is alert and oriented to person, place, and time. Coordination normal.  Skin: Skin is warm and dry. No rash noted. No erythema.  Psychiatric: She has a normal mood and affect. Her behavior is normal. Judgment and thought content normal.       Assessment and Plan

## 2010-12-12 NOTE — Letter (Signed)
Summary: Medical Record Release  Medical Record Release   Imported By: Harlon Flor 12/06/2010 09:47:11  _____________________________________________________________________  External Attachment:    Type:   Image     Comment:   External Document

## 2010-12-12 NOTE — Assessment & Plan Note (Signed)
She has hyperlipidemia, family history. We have recommended restarting her statin though she is not interested at this time. She could alternatively try red yeast rice.

## 2010-12-12 NOTE — Assessment & Plan Note (Signed)
History of intra-atrial septal aneurysm and PFO. Would recommend aspirin 81 mg daily.

## 2010-12-12 NOTE — Assessment & Plan Note (Signed)
Known to have left bundle branch block. Stress test in 2006 and 2009 showing septal perfusion abnormality likely secondary to conduction abnormality. She is asymptomatic. No further workup at this time.

## 2010-12-12 NOTE — Patient Instructions (Signed)
Follow up as needed. No changes were made today.

## 2010-12-28 ENCOUNTER — Telehealth: Payer: Self-pay | Admitting: Cardiovascular Disease

## 2010-12-28 NOTE — Telephone Encounter (Signed)
Notified patient spoke with Jill Alexanders at [facility] center record release sent to [facility] on 12/12/10 and was told takes 10 business days.

## 2010-12-28 NOTE — Telephone Encounter (Signed)
Pt would like confirmation that her records from [place] have been received.

## 2011-02-09 NOTE — Assessment & Plan Note (Signed)
[facility]                               PULMONARY OFFICE NOTE   NAME:[name] Conway, [name] Conway                 MRN:          [id number]  DATE:07/12/2006                            DOB:          [birth year]    REASON FOR CONSULTATION:  Cough.   HISTORY:  A 55 year old white female who quit smoking in 1974 and had no  respiratory complaints until a month ago with indolent onset of a dry  hacking cough that seemed worse when she tried to lie down at night and also  for the first hour or two in the morning. She is able to sleep through the  cough now but is aggravated that the cough is not completely gone. She has  already tried a Z-Pak and Prilosec with no benefit.   PAST MEDICAL HISTORY:  Significant for hyperlipidemia and gestational  diabetes. She has a patent PFO and is followed at [facility] for this.   ALLERGIES:  Unknown.   MEDICATIONS:  Aspirin, multivitamins, Caltrate and Lipitor.   SOCIAL HISTORY:  She quit smoking in 1974 with no respiratory complaints  there. She is a stay at home mom.   FAMILY HISTORY:  Significant for the absence of atopy.   REVIEW OF SYSTEMS:  Taken in detail is significant for the absence of  additional complaints.   PHYSICAL EXAMINATION:  GENERAL:  This is a very pleasant ambulatory white  female in no acute distress who clears her throat frequently during the  interview and exam.  VITAL SIGNS:  She is afebrile.  HEENT:  Remarkable for the absence of turbinate edema. Oropharynx is clear  with no cobblestoning. Dentition is intact. Ear canals clear bilaterally  with no cough elicited.  LUNGS:  Fields were perfectly clear bilaterally to auscultation and  percussion with no cough elicited on inspiratory or expiratory maneuvers. He  has a regular rate and rhythm without murmur, gallop or rub present.  ABDOMEN:  Soft and benign.  EXTREMITIES:  Warm without calf tenderness, cyanosis, clubbing or edema.   Chest x-ray was  normal on June 19, 2006.   IMPRESSION:  Cyclical cough that was worse on lying down. Sounds like either  reflux or rhinitis but has not responded previously to Prilosec. This may be  because once the patient begins coughing, there is enough secondary reflux  from coughing that even nonacid injury can perpetuate the cough itself.   I explained the cyclical cough mechanism to the patient and recommended the  following approach in detail in writing.   Observe diet which includes the avoidance of mint and menthol.   Zegerid 40 mg at bedtime since that is when her symptoms are the worst.   Take Delsym 2 teaspoons b.i.d. supplemented by Tramadol if necessary to  control coughing and take prednisone for a 6-day course only.   If this does not eradicate the cough, I would recommend a sinus CT scan and  possibly a methacholine challenge test as the next logical steps emphasizing  the empiric guidelines for management of cough by Dr. [place] of the  [place] [facility] [place].  ______________________________  Charlaine Dalton Sherene Sires, MD, [facility]      MBW/MedQ  DD:  07/12/2006  DT:  07/15/2006  Job #:  [id number]   cc:   Marne A. Milinda Antis, MD

## 2011-02-09 NOTE — Assessment & Plan Note (Signed)
HEALTHCARE                         GASTROENTEROLOGY OFFICE NOTE   NAME:STRAUSBAUGHGenna, [name]                   MRN:          [id number]  DATE:05/20/2007                            DOB:          04/21/1956    [name] Conway is a very nice 55 year old white female who is here  today because of an episode of nausea.  She describes 3 episodes of  nausea in the last 3 months.  The first occurred when she was [place] the  beach in the morning while sitting in a restaurant before having  breakfast.  It lasted about 4 hours.  She had to drive from the beach to  [place] and had to stop several times, but there was no vomiting.  The second episode occurred while in [place] before going to lunch  with friends.  She became sick, had to lie down, which helped with the  nausea.  The third episode occurred again at the beach after evening  meal.  It was not severe and it dissipated spontaneously after a couple  of hours.  She has had constant belching and burping, but no heartburn.  No dysphagia, odynophagia, or abdominal pain.  She has a positive family  history of gallbladder disease in her mother, who had a cholecystectomy  at age of 71.  The patient has cut out a lot of fatty foods, as well as  caffeine from her diet, which has decreased her occasional indigestion.  We have seen [name] Conway in the past for screening colonoscopy.  She  has a family history of colon cancer in her parents.  This was in  November of 2004 and a colon exam was normal.  She is scheduled to have  another exam in November 2009.   MEDICATIONS:  1. Aspirin 81 mg p.o. daily for atrioseptal aneurysm followed at [facility].  2. Multiple vitamins.  3. Lipitor 10 mg p.o. daily.  4. Viactiv.   PAST HISTORY:  Gestational diabetes.  Hyperlipidemia.  Panic disorder.   FAMILY HISTORY:  Positive colon cancer.  Heart disease in father.  Diabetes in both parents.   SOCIAL HISTORY:  Married with 2  children.  Retired from Agricultural consultant.  Does  not smoke.  Does not drink.  She used to smoke when in college.   REVIEW OF SYSTEMS:  Essentially negative.   PHYSICAL EXAM:  Blood pressure 112/70, pulse 68, and weight 122 pounds,  and stable.  She was alert and oriented in no distress.  SKIN:  Warm and dry.  No palmar erythema.  Sclerae not icteric.  NECK:  Supple.  No adenopathy.  LUNGS:  Clear to auscultation.  COR:  Normal S1, normal S2.  ABDOMEN:  Soft.  Nontender.  Normoactive bowel sounds.  No increased  bowel sounds.  No borborygmi.  No distension.  Liver edge at costal  margin.  No tenderness in the lower abdomen.  RECTAL:  Some Hemoccult negative stool.  EXTREMITIES:  No edema.   IMPRESSION:  A 55 year old white female with 3 separate episodes of  nausea without vomiting, abdominal pain, or any other constitutional  symptoms.  The episodes do not  seem to be progressive.  She has not had  one for at least a month now.  The etiology is not clear.  This could be  hyperacidity, gastric spasm, also possible mild biliary dysfunction.  This could be irritable bowel syndrome or non-gastrointestinal-related  problems, such as intestinal migraine, metabolic reasons.  She shows no  symptoms of gastroparesis.   PLAN:  I have discussed this with the patient at length, because I am  not really sure what the etiology of these episodes is, but do not seem  to be progressive.  I could not demonstrate any abnormality on physical  exam. I have put her on Pepcid 40 mg a day for hyperacidity for at least  a month to see whether we can prevent the nausea in case it is related  to hyperacidity.  She also has Phenergan from Dr. Milinda Antis, and I have  scheduled her for upper abdominal ultrasound to assess her for possible  gallbladder disease.  I will be happy to see her should the episodes  become more progressive.     Hedwig Morton. Juanda Chance, MD  Electronically Signed    DMB/MedQ  DD: 05/20/2007  DT:  05/21/2007  Job #: [id number]   cc:   Marne A. Milinda Antis, MD

## 2011-10-10 ENCOUNTER — Telehealth: Payer: Self-pay | Admitting: Cardiovascular Disease

## 2011-10-10 ENCOUNTER — Telehealth: Payer: Self-pay | Admitting: *Deleted

## 2011-10-10 NOTE — Telephone Encounter (Signed)
Hard to tell. She has family hx. Can't treadmill as she has LBBB She could monitor sx and call us back for more chest pain. SHe interested in a statin for cholesterol?

## 2011-10-10 NOTE — Telephone Encounter (Signed)
Pt last seen 11/2010, hx of hyperlipidemia, strong family history of coronary artery disease with father who had bypass surgery in his late 67s, atrial septal aneurysm and PFO, LBBB. She was told to f/u PRN at that time.  Spoke to pt, she said yesterday at rest, CP came on lasted , she took [place] and ASA 81, and eventually went away. She wants me to pass this along to Dr. Mariah Milling to see if he thinks she needs to be seen, otherwise, she is going to monitor her symptoms and call us back for any recurrent problems.

## 2011-10-10 NOTE — Telephone Encounter (Signed)
I spoke w/pt b/c she was requesting earlier new pt apt. Offered end of Feb but pt declined due to work. She c/o episode of sharp pains in her chest off and on x 10 min yesterday. No SOB, dizziness, sweats, nausea, vomiting or any other complaints. She was previous pt of Dr Milinda Antis then moved to another MD. She also was evaluated by Dr Mariah Milling in march and has cardiologist in [place] where she lives part time. Echo in June 2012 was normal per pt. I advised her to go to ER or call cardiologist office w/recurrent symptoms, pt agreed.

## 2011-10-10 NOTE — Telephone Encounter (Signed)
Patient called to let us know that she was having a lot of chest discomfort yesterday.  She wants to know when she can be wi with Dr.Gollan.  I do not have the security to force appointments and the next available appt he has is not soon enough.

## 2011-10-12 NOTE — Telephone Encounter (Signed)
Notified patient of LBBB and having a treadmill, told patient she could call us back if has any more chest pain and that we could try her on a statin.  She will call back if has any more problems.

## 2011-10-29 LAB — HM DEXA SCAN

## 2011-11-15 ENCOUNTER — Ambulatory Visit: Payer: BC Managed Care – PPO | Admitting: Internal Medicine

## 2011-12-17 ENCOUNTER — Encounter: Payer: Self-pay | Admitting: Internal Medicine

## 2011-12-17 ENCOUNTER — Ambulatory Visit (INDEPENDENT_AMBULATORY_CARE_PROVIDER_SITE_OTHER): Payer: BC Managed Care – PPO | Admitting: Internal Medicine

## 2011-12-17 VITALS — BP 102/70 | HR 86 | Temp 98.0°F | Resp 14 | Ht 64.0 in | Wt 116.5 lb

## 2011-12-17 DIAGNOSIS — Z789 Other specified health status: Secondary | ICD-10-CM

## 2011-12-17 DIAGNOSIS — E785 Hyperlipidemia, unspecified: Secondary | ICD-10-CM

## 2011-12-17 DIAGNOSIS — N390 Urinary tract infection, site not specified: Secondary | ICD-10-CM

## 2011-12-17 DIAGNOSIS — E569 Vitamin deficiency, unspecified: Secondary | ICD-10-CM

## 2011-12-17 DIAGNOSIS — R7309 Other abnormal glucose: Secondary | ICD-10-CM

## 2011-12-17 DIAGNOSIS — R634 Abnormal weight loss: Secondary | ICD-10-CM

## 2011-12-17 DIAGNOSIS — E119 Type 2 diabetes mellitus without complications: Secondary | ICD-10-CM

## 2011-12-17 MED ORDER — DOCUSATE SODIUM 50 MG/5ML PO LIQD: ORAL | Status: DC

## 2011-12-17 MED ORDER — AMOXICILLIN-POT CLAVULANATE 875-125 MG PO TABS: 1.0000 | ORAL_TABLET | Freq: Two times a day (BID) | ORAL | Status: AC

## 2011-12-17 NOTE — Assessment & Plan Note (Signed)
Discussed risk inherent to upcoming transatlantic trip, including DVT, CA illness.  Recommended increase asa to 325 mg prior to trip,  Walking several times on flight to pump calves,  Compression stockings,  And avoid use of overhead air vent./wear mask.  Has rx for cipro,  Gave rx for augmentin.

## 2011-12-17 NOTE — Assessment & Plan Note (Addendum)
managed by urology,  Has  Bjorn Pippin  No history of pyelonephritis despite retained right renal calculus per patient , just cystitis.

## 2011-12-17 NOTE — Patient Instructions (Signed)
Consider the Low Glycemic Index Diet and 6 smaller meals daily :   7 AM Low carbohydrate Protein  Shakes (EAS Carb Control  Or Atkins ,  Available everywhere,   In  cases at [employer] )  2.5 carbs  (Add or substitute a toasted sandwhich thin w/ peanut butter)  10 AM: Protein bar by Atkins (snack size,  Chocolate lover's variety at  [employer])    Lunch: sandwich on pita bread or flatbread (Joseph's makes a pita bread and a flat bread , available at [employer] and [employer]; Toufayah makes a low carb flatbread available at [employer] and [employer])  [employer] both make low carb tortillas   3 PM:  Mid day :  Another protein bar,  Or a  cheese stick, 1/4 cup of almonds, walnuts, pistachios, pecans, peanuts,  Macadamia nuts  6 PM  Dinner:  "mean and green:"  Meat/chicken/fish, salad, and green veggie : use ranch, vinagrette,  Blue cheese, etc  9 PM snack : Breyer's low carb fudgiscle or  ice cream bar (Carb Smart) Weight Watcher's ice cream bar , or another protein shake

## 2011-12-17 NOTE — Assessment & Plan Note (Signed)
Last LDl was 135 Nov 2011.  Repeat ordered.

## 2011-12-17 NOTE — Progress Notes (Signed)
Patient ID: [name] Conway, female   DOB: [birth year], 56 y.o.   MRN: [id number]    Patient Active Problem List  Diagnoses  . UNSPECIFIED VITAMIN D DEFICIENCY  . HYPERLIPIDEMIA  . PANIC DISORDER  . GERD  . URINARY TRACT INFECTION, RECURRENT  . HYPERGLYCEMIA  . DIABETES MELLITUS, GESTATIONAL, HX OF  . ABDOMINAL PAIN, GENERALIZED  . FLANK PAIN, LEFT  . NEPHROLITHIASIS, HX OF  . Abnormal EKG  . PFO (patent foramen ovale)  . Foreign travel    Subjective:  CC:   Chief Complaint  Patient presents with  . New Patient    HPI:   [name] Conway a 56 y.o. female who presents  With need to establish care.  Her former PCP was [facility], followed by a brief tenure with Veneda Melter.  She has a history of a bundle branch block associated with ASD and PFO found on ECHO many years ago.  On daily aspirin.  She had a history of gestational diabetes, and continues to have hgba1c in the diabetes range of 6.5.  When she cut out refined sugars , howeveer, her weight dropped from 125 to 115. History of hyperlipidemia previously managed with statin but discontinued by patient for LDL 135 1 year ago.  She exercises with daily walking, has  had her annual pelvic without PAP and  Annual mammogram by her gynecologist this past Fall and has a bone density test scheduled to follow up at 3 yrs on previously reported osteopenia  Does not want therapy    Takes 1000 units of Vit D daily along wit 1 calcium supplement daily.  Chief complaint is bones hurting more than they should after bumps.  Has always been an easy bruiser.  No history of fractures.  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  Has occasional dizzziness with sudden changes in head position, wonders if she has fluid behind her TMs   Finally. Going to [place] in May with 65 yr old daughter, wondering if she should wear a mask.    Past Medical History  Diagnosis Date  . Atrial  septal aneurysm   . Diabetes mellitus   . Panic disorder   . GERD (gastroesophageal reflux disease)   . Family history of colorectal cancer   . Facial paresis   . Hyperlipidemia   . Osteopenia   . Hyperglycemia     Past Surgical History  Procedure Date  . Cesarean section     x2  . Breast biopsy     x3         The following portions of the patient's history were reviewed and updated as appropriate: Allergies, current medications, and problem list.    Review of Systems:   12 Pt  review of systems was negative except those addressed in the HPI,     History   Social History  . Marital Status: Married    Spouse Name: N/A    Number of Children: N/A  . Years of Education: N/A   Occupational History  . Not on file.   Social History Main Topics  . Smoking status: Former Smoker -- 3 years    Types: Cigarettes  . Smokeless tobacco: Never Used   Comment: high school  . Alcohol Use: Yes     occasional  . Drug Use: No  . Sexually Active: Not on file   Other Topics Concern  . Not on file   Social  History Narrative  . No narrative on file    Objective:  BP 102/70  Pulse 86  Temp(Src) 98 F (36.7 C) (Oral)  Resp 14  Ht 5\' 4"  (1.626 m)  Wt 116 lb 8 oz (52.844 kg)  BMI 20.00 kg/m2  SpO2 100%  General appearance: alert, cooperative and appears stated age Ears: normal TM's and external ear canals both ears Throat: lips, mucosa, and tongue normal; teeth and gums normal Neck: no adenopathy, no carotid bruit, supple, symmetrical, trachea midline and thyroid not enlarged, symmetric, no tenderness/mass/nodules Back: symmetric, no curvature. ROM normal. No CVA tenderness. Lungs: clear to auscultation bilaterally Heart: regular rate and rhythm, S1, S2 normal, no murmur, click, rub or gallop Abdomen: soft, non-tender; bowel sounds normal; no masses,  no organomegaly Pulses: 2+ and symmetric Skin: Skin color, texture, turgor normal. No rashes or lesions Lymph  nodes: Cervical, supraclavicular, and axillary nodes normal.  Assessment and Plan:  URINARY TRACT INFECTION, RECURRENT managed by urology,  Has  Bjorn Pippin  No history of pyelonephritis despite retained right renal calculus per patient , just cystitis.   HYPERLIPIDEMIA Last LDl was 135 Nov 2011.  Repeat ordered.    HYPERGLYCEMIA With nondiagnostic fastings and hgba1c of 6.5 ,  Low glycemic index discussed.   Foreign travel Discussed risk inherent to upcoming transatlantic trip, including DVT, CA illness.  Recommended increase asa to 325 mg prior to trip,  Walking several times on flight to pump calves,  Compression stockings,  And avoid use of overhead air vent./wear mask.  Has rx for cipro,  Gave rx for augmentin.     Updated Medication List Outpatient Encounter Prescriptions as of 12/17/2011  Medication Sig Dispense Refill  . aspirin 81 MG EC tablet Take 81 mg by mouth daily.        . Calcium Citrate-Vitamin D 250-200 MG-UNIT TABS Take 1 tablet by mouth daily.        Marland Kitchen amoxicillin-clavulanate (AUGMENTIN) 875-125 MG per tablet Take 1 tablet by mouth 2 (two) times daily.  14 tablet  0  . docusate (COLACE) 50 MG/5ML liquid 2 or 3 drops in right ear for cerumen impaction  50 mL  0  . DISCONTD: Cholecalciferol (VITAMIN D3) 1000 UNITS CAPS Take 1 capsule by mouth 2 (two) times daily.       Marland Kitchen DISCONTD: Multiple Vitamin (MULTIVITAMIN) tablet Take 1 tablet by mouth daily.        Marland Kitchen DISCONTD: nitrofurantoin, macrocrystal-monohydrate, (MACROBID) 100 MG capsule Take 100 mg by mouth 2 (two) times daily as needed. For 7 days      . DISCONTD: valACYclovir (VALTREX) 1000 MG tablet Take 1,000 mg by mouth 2 (two) times daily.           Orders Placed This Encounter  Procedures  . TSH  . Lipid panel  . COMPLETE METABOLIC PANEL WITH GFR  . Microalbumin / creatinine urine ratio  . Hemoglobin A1c  . Vitamin D (25 hydroxy)  . HM PAP SMEAR  . HM COLONOSCOPY    No Follow-up on file.

## 2011-12-17 NOTE — Assessment & Plan Note (Signed)
With nondiagnostic fastings and hgba1c of 6.5 ,  Low glycemic index discussed.

## 2011-12-20 ENCOUNTER — Other Ambulatory Visit (INDEPENDENT_AMBULATORY_CARE_PROVIDER_SITE_OTHER): Payer: BC Managed Care – PPO | Admitting: *Deleted

## 2011-12-20 DIAGNOSIS — N390 Urinary tract infection, site not specified: Secondary | ICD-10-CM

## 2011-12-20 DIAGNOSIS — E785 Hyperlipidemia, unspecified: Secondary | ICD-10-CM

## 2011-12-20 DIAGNOSIS — Z789 Other specified health status: Secondary | ICD-10-CM

## 2011-12-20 DIAGNOSIS — R7309 Other abnormal glucose: Secondary | ICD-10-CM

## 2011-12-20 DIAGNOSIS — E119 Type 2 diabetes mellitus without complications: Secondary | ICD-10-CM

## 2011-12-20 LAB — LIPID PANEL
HDL: 67.5 mg/dL (ref >39.00)
Total CHOL/HDL Ratio: 3
Triglycerides: 57 mg/dL (ref 0.0–149.0)

## 2011-12-20 LAB — COMPLETE METABOLIC PANEL WITH GFR
ALT: 13 U/L (ref 0–35)
AST: 20 U/L (ref 0–37)
Alkaline Phosphatase: 77 U/L (ref 39–117)
Sodium: 140 mEq/L (ref 135–145)
Total Bilirubin: 0.5 mg/dL (ref 0.3–1.2)
Total Protein: 6.5 g/dL (ref 6.0–8.3)

## 2011-12-20 LAB — LDL CHOLESTEROL, DIRECT: Direct LDL: 138.7 mg/dL

## 2011-12-20 LAB — MICROALBUMIN / CREATININE URINE RATIO: Microalb Creat Ratio: 0.8 mg/g (ref 0.0–30.0)

## 2011-12-20 LAB — HEMOGLOBIN A1C: Hgb A1c MFr Bld: 6.1 % (ref 4.6–6.5)

## 2011-12-21 LAB — VITAMIN D 25 HYDROXY (VIT D DEFICIENCY, FRACTURES): Vit D, 25-Hydroxy: 44 ng/mL (ref 30–89)

## 2012-01-08 NOTE — Telephone Encounter (Signed)
ENCOUNTER COMPLETED 

## 2012-01-31 ENCOUNTER — Telehealth: Payer: Self-pay | Admitting: Internal Medicine

## 2012-01-31 NOTE — Telephone Encounter (Signed)
Bone Density results reviewed.  She has had a Mild loss in bone density since earlier scan,  Will discuss at next visit.

## 2012-02-01 NOTE — Telephone Encounter (Signed)
Nicki notified patient of results.

## 2012-02-01 NOTE — Telephone Encounter (Signed)
Left message asking patient to return my call.

## 2012-06-03 ENCOUNTER — Encounter: Payer: Self-pay | Admitting: Internal Medicine

## 2012-06-03 ENCOUNTER — Ambulatory Visit (INDEPENDENT_AMBULATORY_CARE_PROVIDER_SITE_OTHER): Payer: BC Managed Care – PPO | Admitting: Internal Medicine

## 2012-06-03 ENCOUNTER — Other Ambulatory Visit: Payer: BC Managed Care – PPO

## 2012-06-03 VITALS — BP 104/62 | HR 76 | Temp 98.0°F | Resp 14 | Wt 115.0 lb

## 2012-06-03 DIAGNOSIS — Z9189 Other specified personal risk factors, not elsewhere classified: Secondary | ICD-10-CM | POA: Insufficient documentation

## 2012-06-03 DIAGNOSIS — E119 Type 2 diabetes mellitus without complications: Secondary | ICD-10-CM

## 2012-06-03 DIAGNOSIS — Z789 Other specified health status: Secondary | ICD-10-CM

## 2012-06-03 LAB — LIPID PANEL
HDL: 67.3 mg/dL (ref >39.00)
LDL Cholesterol: 108 mg/dL — ABNORMAL HIGH (ref 0–99)
Total CHOL/HDL Ratio: 3
VLDL: 13.2 mg/dL (ref 0.0–40.0)

## 2012-06-03 LAB — COMPREHENSIVE METABOLIC PANEL
ALT: 14 U/L (ref 0–35)
AST: 25 U/L (ref 0–37)
Albumin: 4.4 g/dL (ref 3.5–5.2)
Alkaline Phosphatase: 58 U/L (ref 39–117)
Glucose, Bld: 97 mg/dL (ref 70–99)
Potassium: 3.9 mEq/L (ref 3.5–5.1)
Sodium: 140 mEq/L (ref 135–145)
Total Bilirubin: 0.7 mg/dL (ref 0.3–1.2)
Total Protein: 7 g/dL (ref 6.0–8.3)

## 2012-06-03 NOTE — Assessment & Plan Note (Signed)
Results reviewed with patient and comparison to prior DEXA done .  Lengthy discussion of risk and benefits of the choices   Of pharmacologic treatments.  She has deferred treatment.  Recommended to add more weight bearing exercise to regimen

## 2012-06-03 NOTE — Progress Notes (Signed)
Patient ID: [name] Conway, female   DOB: 13-May-1956, 56 y.o.   MRN: [id number] Patient Active Problem List  Diagnosis  . UNSPECIFIED VITAMIN D DEFICIENCY  . HYPERLIPIDEMIA  . PANIC DISORDER  . GERD  . URINARY TRACT INFECTION, RECURRENT  . HYPERGLYCEMIA  . DIABETES MELLITUS, GESTATIONAL, HX OF  . ABDOMINAL PAIN, GENERALIZED  . FLANK PAIN, LEFT  . NEPHROLITHIASIS, HX OF  . Abnormal EKG  . PFO (patent foramen ovale)  . Foreign travel  . Osteoporosis/osteopenia increased risk    Subjective:  CC:   Chief Complaint  Patient presents with  . Bone Density Results    HPI:   [name] Degraff Strausbaughis a 56 y.o. female who presents to discuss the results of her DEXA scan done April 2013 at [facility].  She takes 2000 units of Vitamin D3 and gets calcium 1200 mg daily through diet.  Does not want to start meds.  No history of fragility fractures,  No FH of osteoporetic fractures either.       Past Medical History  Diagnosis Date  . Atrial septal aneurysm   . Diabetes mellitus   . Panic disorder   . GERD (gastroesophageal reflux disease)   . Family history of colorectal cancer   . Facial paresis   . Hyperlipidemia   . Osteopenia   . Hyperglycemia     Past Surgical History  Procedure Date  . Cesarean section     x2  . Breast biopsy     x3         The following portions of the patient's history were reviewed and updated as appropriate: Allergies, current medications, and problem list.    Review of Systems:   12 Pt  review of systems was negative .     History   Social History  . Marital Status: Married    Spouse Name: N/A    Number of Children: N/A  . Years of Education: N/A   Occupational History  . Not on file.   Social History Main Topics  . Smoking status: Former Smoker -- 3 years    Types: Cigarettes  . Smokeless tobacco: Never Used   Comment: high school  . Alcohol Use: Yes     occasional  . Drug Use: No  . Sexually Active: Not on file     Other Topics Concern  . Not on file   Social History Narrative  . No narrative on file    Objective:  BP 104/62  Pulse 76  Temp 98 F (36.7 C) (Oral)  Resp 14  Wt 115 lb (52.164 kg)  SpO2 96%  General appearance: alert, cooperative and appears stated age Skin: Skin color, texture, turgor normal. No rashes or lesions Lymph nodes: Cervical, supraclavicular, and axillary nodes normal.  Assessment and Plan:  Osteoporosis/osteopenia increased risk Results reviewed with patient and comparison to prior DEXA done .  Spent 15 minutes in discussion of risk and benefits of the choices   Of pharmacologic treatments.  She has deferred treatment.  Recommended to add more weight bearing exercise to regimen    Updated Medication List Outpatient Encounter Prescriptions as of 06/03/2012  Medication Sig Dispense Refill  . aspirin 81 MG EC tablet Take 81 mg by mouth daily.        . cholecalciferol (VITAMIN D) 1000 UNITS tablet Take 1,000 Units by mouth daily.      . Calcium Citrate-Vitamin D 250-200 MG-UNIT TABS Take 1 tablet by mouth daily.        Marland Kitchen  DISCONTD: docusate (COLACE) 50 MG/5ML liquid 2 or 3 drops in right ear for cerumen impaction  50 mL  0     Orders Placed This Encounter  Procedures  . Lipid panel  . Comprehensive metabolic panel  . Hemoglobin A1c    No Follow-up on file.

## 2012-06-25 ENCOUNTER — Ambulatory Visit (INDEPENDENT_AMBULATORY_CARE_PROVIDER_SITE_OTHER): Payer: BC Managed Care – PPO | Admitting: Internal Medicine

## 2012-06-25 DIAGNOSIS — Z23 Encounter for immunization: Secondary | ICD-10-CM

## 2012-06-25 NOTE — Progress Notes (Signed)
Patient ID: [name] Conway, female   DOB: 07-09-1956, 56 y.o.   MRN: [id number]  Patient is here for a TDaP vaccine.

## 2012-09-26 ENCOUNTER — Ambulatory Visit (INDEPENDENT_AMBULATORY_CARE_PROVIDER_SITE_OTHER): Payer: BC Managed Care – PPO | Admitting: Adult Health

## 2012-09-26 ENCOUNTER — Encounter: Payer: Self-pay | Admitting: Adult Health

## 2012-09-26 VITALS — BP 138/79 | HR 66 | Temp 98.3°F | Resp 14 | Ht 63.0 in | Wt 118.0 lb

## 2012-09-26 DIAGNOSIS — N390 Urinary tract infection, site not specified: Secondary | ICD-10-CM

## 2012-09-26 LAB — POCT URINALYSIS DIPSTICK
Bilirubin, UA: NEGATIVE
Glucose, UA: NEGATIVE
Nitrite, UA: NEGATIVE
Spec Grav, UA: 1.015
Urobilinogen, UA: 0.2

## 2012-09-26 MED ORDER — CIPROFLOXACIN HCL 500 MG PO TABS: 500.0000 mg | ORAL_TABLET | Freq: Two times a day (BID) | ORAL | Status: DC

## 2012-09-26 NOTE — Patient Instructions (Addendum)
  Start your antibiotic today.  Drink plenty of fluids.  You can take pyridium (AZO, urostat) to relieve the burning sensation.  Call if your symptoms are not improved in the next 2-3 days.

## 2012-09-26 NOTE — Assessment & Plan Note (Addendum)
Symptoms consistent with complicated UTI not requiring hospitalization. Patient has frequent UTIs. She is followed by Urology. Also has hx of small nephrolithiasis.without any symptoms. Treat with Cipro x 7 days. May also take pyridium for burning.

## 2012-09-26 NOTE — Progress Notes (Signed)
  Subjective:    Patient ID: [name] Conway, female    DOB: [birth year], 57 y.o.   MRN: [id number]  HPI  Patient presents to clinic with dysuria. Reports frequent UTI.  Current Outpatient Prescriptions on File Prior to Visit  Medication Sig Dispense Refill  . aspirin 81 MG EC tablet Take 81 mg by mouth daily.        . cholecalciferol (VITAMIN D) 1000 UNITS tablet Take 1,000 Units by mouth daily.        Review of Systems  Constitutional: Negative for fever and chills.  Cardiovascular: Negative.   Gastrointestinal: Negative.   Genitourinary: Positive for dysuria. Negative for hematuria, flank pain and pelvic pain.  Neurological: Negative.   Psychiatric/Behavioral: Negative.      BP 138/79  Pulse 66  Temp 98.3 F (36.8 C) (Oral)  Resp 14  Ht 5\' 3"  (1.6 m)  Wt 118 lb (53.524 kg)  BMI 20.90 kg/m2  SpO2 100%     Objective:   Physical Exam  Constitutional: She is oriented to person, place, and time. She appears well-developed and well-nourished. No distress.  Cardiovascular: Normal rate, regular rhythm and normal heart sounds.   No murmur heard. Pulmonary/Chest: Effort normal and breath sounds normal.  Abdominal: Soft. Bowel sounds are normal. She exhibits no mass. There is no tenderness.  Neurological: She is alert and oriented to person, place, and time.  Skin: Skin is warm and dry.  Psychiatric: She has a normal mood and affect. Her behavior is normal. Judgment and thought content normal.       Assessment & Plan:

## 2012-09-29 ENCOUNTER — Ambulatory Visit: Payer: BC Managed Care – PPO | Admitting: Adult Health

## 2012-09-30 LAB — URINE CULTURE: Colony Count: >=100000

## 2013-01-07 ENCOUNTER — Encounter: Payer: Self-pay | Admitting: Internal Medicine

## 2013-01-07 ENCOUNTER — Ambulatory Visit (INDEPENDENT_AMBULATORY_CARE_PROVIDER_SITE_OTHER): Payer: BC Managed Care – PPO | Admitting: Internal Medicine

## 2013-01-07 VITALS — BP 106/70 | HR 78 | Temp 97.8°F | Resp 16 | Wt 112.2 lb

## 2013-01-07 DIAGNOSIS — R7309 Other abnormal glucose: Secondary | ICD-10-CM

## 2013-01-07 DIAGNOSIS — N362 Urethral caruncle: Secondary | ICD-10-CM

## 2013-01-07 DIAGNOSIS — E785 Hyperlipidemia, unspecified: Secondary | ICD-10-CM

## 2013-01-07 NOTE — Progress Notes (Signed)
Patient ID: [name] Conway, female   DOB: 1956/05/25, 57 y.o.   MRN: [id number]     Patient Active Problem List  Diagnosis  . UNSPECIFIED VITAMIN D DEFICIENCY  . HYPERLIPIDEMIA  . PANIC DISORDER  . GERD  . HYPERGLYCEMIA  . DIABETES MELLITUS, GESTATIONAL, HX OF  . ABDOMINAL PAIN, GENERALIZED  . FLANK PAIN, LEFT  . NEPHROLITHIASIS, HX OF  . Abnormal EKG  . PFO (patent foramen ovale)  . Osteoporosis/osteopenia increased risk  . Urethral polyp    Subjective:  CC:   Chief Complaint  Patient presents with  . Acute Visit    vaginal issue    HPI:   [name] Stegner Strausbaughis a 57 y.o. female who presents for  evaluation of abnormal vaginal finding by husband. Patient states that she has been having vaginal dryness and occasional discharge and asked her husband to examine her vagina for any abnormalities that he could see. He thought he noticed an anatomical change, specifically that he saw a "hole" in her vagina that wasn't present on prior sexual encounter.  She feels somewhat embarrassed by the complaint today but is not alareasmed.   She denies urinary incontinence, but has occasional constipation, managed  with a stool softener,  And has no history of piercings .     Past Medical History  Diagnosis Date  . Atrial septal aneurysm   . Diabetes mellitus   . Panic disorder   . GERD (gastroesophageal reflux disease)   . Family history of colorectal cancer   . Facial paresis   . Hyperlipidemia   . Osteopenia   . Hyperglycemia     Past Surgical History  Procedure Laterality Date  . Cesarean section      x2  . Breast biopsy      x3       The following portions of the patient's history were reviewed and updated as appropriate: Allergies, current medications, and problem list.    Review of Systems:   Patient denies headache, fevers, malaise, unintentional weight loss, skin rash, eye pain, sinus congestion and sinus pain, sore throat, dysphagia,  hemoptysis ,  cough, dyspnea, wheezing, chest pain, palpitations, orthopnea, edema, abdominal pain, nausea, melena, diarrhea, constipation, flank pain, dysuria, hematuria, urinary  Frequency, nocturia, numbness, tingling, seizures,  Focal weakness, Loss of consciousness,  Tremor, insomnia, depression, anxiety, and suicidal ideation.     History   Social History  . Marital Status: Married    Spouse Name: N/A    Number of Children: N/A  . Years of Education: N/A   Occupational History  . Not on file.   Social History Main Topics  . Smoking status: Former Smoker -- 3 years    Types: Cigarettes  . Smokeless tobacco: Never Used     Comment: high school  . Alcohol Use: Yes     Comment: occasional  . Drug Use: No  . Sexually Active: Not on file   Other Topics Concern  . Not on file   Social History Narrative  . No narrative on file    Objective:  BP 106/70  Pulse 78  Temp(Src) 97.8 F (36.6 C) (Oral)  Resp 16  Wt 112 lb 4 oz (50.916 kg)  BMI 19.89 kg/m2  SpO2 96%  General appearance: alert, cooperative and appears stated age Back: symmetric, no curvature. ROM normal. No CVA tenderness. Lungs: clear to auscultation bilaterally Heart: regular rate and rhythm, S1, S2 normal, no murmur, click, rub or gallop Abdomen: soft, non-tender; bowel sounds  normal; no masses,  no organomegaly Pulses: 2+ and symmetric Skin: Skin color, texture, turgor normal. No rashes or lesions Lymph nodes: Cervical, supraclavicular, and axillary nodes normal.  Assessment and Plan:  Urethral polyp  Speculum and bimanual am of the vagina was done today due to patient's concerns raised by her husband of an abnormal vaginal opening. I found nothing abnormal except for a small urethral polyp which may have appeared to be an opening if viewed in a shadow in the incorrect lighting. Reassurance provided. She has a followup visit with her gynecologist next month. She can address whether this needs to be biopsied with  him.  HYPERLIPIDEMIA We took a few minutes to review her prior blood work from September. She has mild hyperlipidemia which does not require treatment.  HYPERGLYCEMIA She has a history of gestational diabetes and has had elevated glucose readings in the past neck, it never diagnostic of diabetes. Continued annual screening for   diabetes.   Updated Medication List Outpatient Encounter Prescriptions as of 01/07/2013  Medication Sig Dispense Refill  . aspirin 81 MG EC tablet Take 81 mg by mouth daily.        . cholecalciferol (VITAMIN D) 1000 UNITS tablet Take 1,000 Units by mouth daily.      . ciprofloxacin (CIPRO) 500 MG tablet Take 1 tablet (500 mg total) by mouth 2 (two) times daily.  14 tablet  0   No facility-administered encounter medications on file as of 01/07/2013.     No orders of the defined types were placed in this encounter.    No Follow-up on file.

## 2013-01-07 NOTE — Patient Instructions (Addendum)
You have no "holes" that shouldn't be there.  I think your husband saw a small urethral polyp, which your gynecologist can biopsy if she feels it is necessary  Return in September for fasting labs

## 2013-01-08 ENCOUNTER — Encounter: Payer: Self-pay | Admitting: Internal Medicine

## 2013-01-08 DIAGNOSIS — N362 Urethral caruncle: Secondary | ICD-10-CM | POA: Insufficient documentation

## 2013-01-08 NOTE — Assessment & Plan Note (Signed)
She has a history of gestational diabetes and has had elevated glucose readings in the past neck, it never diagnostic of diabetes. Continued annual screening for   diabetes.

## 2013-01-08 NOTE — Assessment & Plan Note (Signed)
We took a few minutes to review her prior blood work from September. She has mild hyperlipidemia which does not require treatment.

## 2013-01-08 NOTE — Assessment & Plan Note (Signed)
  Speculum exam of the vagina was done today due to patient's concerns raised by her husband of an abnormal vaginal opening. I found nothing abnormal except for a small urethral polyp which may have appeared to be a shadow in the incorrect lighting. Reassurance provided. She has a followup visit with her gynecologist next month. She can address whether this needs to be biopsied with him.

## 2013-04-20 ENCOUNTER — Encounter: Payer: Self-pay | Admitting: Internal Medicine

## 2013-04-20 ENCOUNTER — Ambulatory Visit (INDEPENDENT_AMBULATORY_CARE_PROVIDER_SITE_OTHER): Payer: BC Managed Care – PPO | Admitting: Internal Medicine

## 2013-04-20 VITALS — BP 122/68 | HR 79 | Temp 97.9°F | Resp 12 | Wt 115.8 lb

## 2013-04-20 DIAGNOSIS — N760 Acute vaginitis: Secondary | ICD-10-CM

## 2013-04-20 DIAGNOSIS — N390 Urinary tract infection, site not specified: Secondary | ICD-10-CM

## 2013-04-20 DIAGNOSIS — Z113 Encounter for screening for infections with a predominantly sexual mode of transmission: Secondary | ICD-10-CM

## 2013-04-20 DIAGNOSIS — A499 Bacterial infection, unspecified: Secondary | ICD-10-CM

## 2013-04-20 DIAGNOSIS — R5383 Other fatigue: Secondary | ICD-10-CM

## 2013-04-20 DIAGNOSIS — R5381 Other malaise: Secondary | ICD-10-CM

## 2013-04-20 DIAGNOSIS — O24419 Gestational diabetes mellitus in pregnancy, unspecified control: Secondary | ICD-10-CM

## 2013-04-20 LAB — COMPREHENSIVE METABOLIC PANEL
AST: 25 U/L (ref 0–37)
Albumin: 4 g/dL (ref 3.5–5.2)
Alkaline Phosphatase: 72 U/L (ref 39–117)
Chloride: 104 mEq/L (ref 96–112)
Glucose, Bld: 100 mg/dL — ABNORMAL HIGH (ref 70–99)
Potassium: 4.4 mEq/L (ref 3.5–5.1)
Sodium: 139 mEq/L (ref 135–145)
Total Protein: 6.6 g/dL (ref 6.0–8.3)

## 2013-04-20 LAB — HEMOGLOBIN A1C: Hgb A1c MFr Bld: 6.3 % (ref 4.6–6.5)

## 2013-04-20 LAB — TSH: TSH: 0.9 u[IU]/mL (ref 0.35–5.50)

## 2013-04-20 NOTE — Assessment & Plan Note (Signed)
Repeat pelvic done today, no lesions,  Scan yellow dc,  Culture sent.

## 2013-04-20 NOTE — Progress Notes (Signed)
Patient ID: [name] Conway, female   DOB: [birth year], 57 y.o.   MRN: [id number]  . Patient Active Problem List   Diagnosis Date Noted  . Vaginitis and vulvovaginitis 04/20/2013  . Urethral polyp 01/08/2013  . Osteoporosis/osteopenia increased risk 06/03/2012  . Abnormal EKG 12/12/2010  . PFO (patent foramen ovale) 12/12/2010  . ABDOMINAL PAIN, GENERALIZED 10/12/2010  . FLANK PAIN, LEFT 10/12/2010  . NEPHROLITHIASIS, HX OF 10/12/2010  . HYPERGLYCEMIA 09/28/2008  . UNSPECIFIED VITAMIN D DEFICIENCY 09/20/2008  . PANIC DISORDER 04/08/2008  . DIABETES MELLITUS, GESTATIONAL, HX OF 04/08/2008  . GERD 02/24/2008  . HYPERLIPIDEMIA 01/23/2007    Subjective:  CC:   Chief Complaint  Patient presents with  . Acute Visit    HPI:   [name] Corne Strausbaughis a 57 y.o. female who presents  Past Medical History  Diagnosis Date  . Atrial septal aneurysm   . Diabetes mellitus   . Panic disorder   . GERD (gastroesophageal reflux disease)   . Family history of colorectal cancer   . Facial paresis   . Hyperlipidemia   . Osteopenia   . Hyperglycemia     Past Surgical History  Procedure Laterality Date  . Cesarean section      x2  . Breast biopsy      x3       The following portions of the patient's history were reviewed and updated as appropriate: Allergies, current medications, and problem list.    Review of Systems:   12 Pt  review of systems was negative except those addressed in the HPI,     History   Social History  . Marital Status: Married    Spouse Name: N/A    Number of Children: N/A  . Years of Education: N/A   Occupational History  . Not on file.   Social History Main Topics  . Smoking status: Former Smoker -- 3 years    Types: Cigarettes  . Smokeless tobacco: Never Used     Comment: high school  . Alcohol Use: Yes     Comment: occasional  . Drug Use: No  . Sexually Active: Not on file   Other Topics Concern  . Not on file   Social  History Narrative  . No narrative on file    Objective:  BP 122/68  Pulse 79  Temp(Src) 97.9 F (36.6 C) (Oral)  Resp 12  Wt 115 lb 12 oz (52.504 kg)  BMI 20.51 kg/m2  SpO2 98%  General Appearance:    Alert, cooperative, no distress, appears stated age  Head:    Normocephalic, without obvious abnormality, atraumatic  Eyes:    PERRL, conjunctiva/corneas clear, EOM's intact, fundi    benign, both eyes  Ears:    Normal TM's and external ear canals, both ears  Nose:   Nares normal, septum midline, mucosa normal, no drainage    or sinus tenderness  Throat:   Lips, mucosa, and tongue normal; teeth and gums normal  Neck:   Supple, symmetrical, trachea midline, no adenopathy;    thyroid:  no enlargement/tenderness/nodules; no carotid   bruit or JVD  Back:     Symmetric, no curvature, ROM normal, no CVA tenderness  Lungs:     Clear to auscultation bilaterally, respirations unlabored  Chest Wall:    No tenderness or deformity   Heart:    Regular rate and rhythm, S1 and S2 normal, no murmur, rub   or gallop     Abdomen:  Soft, non-tender, bowel sounds active all four quadrants,    no masses, no organomegaly  Genitalia:    Pelvic: cervix normal in appearance, external genitalia normal, no adnexal masses or tenderness, no cervical motion tenderness, rectovaginal septum normal, uterus normal size, shape, and consistency and vagina normal without discharge  Extremities:   Extremities normal, atraumatic, no cyanosis or edema  Pulses:   2+ and symmetric all extremities  Skin:   Skin color, texture, turgor normal, no rashes or lesions  Lymph nodes:   Cervical, supraclavicular, and axillary nodes normal     Assessment and Plan:  Vaginitis and vulvovaginitis Repeat pelvic done today, no lesions,  Scan yellow dc,  Culture sent.     Updated Medication List Outpatient Encounter Prescriptions as of 04/20/2013  Medication Sig Dispense Refill  . aspirin 81 MG EC tablet Take 81 mg by mouth  daily.        . cholecalciferol (VITAMIN D) 1000 UNITS tablet Take 1,000 Units by mouth daily.      . nitrofurantoin (MACRODANTIN) 100 MG capsule Take 100 mg by mouth 2 (two) times daily.      . ciprofloxacin (CIPRO) 500 MG tablet Take 1 tablet (500 mg total) by mouth 2 (two) times daily.  14 tablet  0   No facility-administered encounter medications on file as of 04/20/2013.     Orders Placed This Encounter  Procedures  . WET PREP BY MOLECULAR PROBE  . Hemoglobin A1c  . Comprehensive metabolic panel  . TSH  . Hepatitis C Antibody  . HIV Antibody ( Reflex)  . Hemoglobin A1c    No Follow-up on file.

## 2013-04-20 NOTE — Patient Instructions (Addendum)
Your exam appeared normal but I sent a culture to confirm.  Call if you develop itching or other signs of yeast infection  Screening labs today

## 2013-04-21 LAB — WET PREP BY MOLECULAR PROBE: Gardnerella vaginalis: NEGATIVE

## 2013-04-22 ENCOUNTER — Encounter: Payer: Self-pay | Admitting: Internal Medicine

## 2013-06-18 ENCOUNTER — Encounter: Payer: Self-pay | Admitting: Internal Medicine

## 2013-06-23 ENCOUNTER — Encounter: Payer: Self-pay | Admitting: Internal Medicine

## 2013-07-30 ENCOUNTER — Other Ambulatory Visit: Payer: Self-pay

## 2013-08-26 ENCOUNTER — Encounter: Payer: Self-pay | Admitting: Internal Medicine

## 2013-08-26 ENCOUNTER — Ambulatory Visit (AMBULATORY_SURGERY_CENTER): Payer: BC Managed Care – PPO | Admitting: *Deleted

## 2013-08-26 VITALS — Ht 63.0 in | Wt 117.4 lb

## 2013-08-26 DIAGNOSIS — Z800 Family history of malignant neoplasm of digestive organs: Secondary | ICD-10-CM

## 2013-08-26 MED ORDER — MOVIPREP 100 G PO SOLR: ORAL | Status: DC

## 2013-08-26 NOTE — Progress Notes (Signed)
No allergies to eggs or soy. No problems with anesthesia.  

## 2013-09-09 ENCOUNTER — Encounter: Payer: Self-pay | Admitting: Internal Medicine

## 2013-09-09 ENCOUNTER — Ambulatory Visit (AMBULATORY_SURGERY_CENTER): Payer: BC Managed Care – PPO | Admitting: Internal Medicine

## 2013-09-09 VITALS — BP 123/65 | HR 65 | Temp 98.1°F | Resp 21 | Ht 63.0 in | Wt 117.0 lb

## 2013-09-09 DIAGNOSIS — Z800 Family history of malignant neoplasm of digestive organs: Secondary | ICD-10-CM

## 2013-09-09 DIAGNOSIS — Z1211 Encounter for screening for malignant neoplasm of colon: Secondary | ICD-10-CM

## 2013-09-09 MED ORDER — SODIUM CHLORIDE 0.9 % IV SOLN: 500.0000 mL | INTRAVENOUS | Status: DC

## 2013-09-09 NOTE — Progress Notes (Signed)
Patient did not experience any of the following events: a burn prior to discharge; a fall within the facility; wrong site/side/patient/procedure/implant event; or a hospital transfer or hospital admission upon discharge from the facility. (G8907) Patient did not have preoperative order for IV antibiotic SSI prophylaxis. (G8918)  

## 2013-09-09 NOTE — Patient Instructions (Signed)
Discharge instructions given with verbal understanding. Normal exam. Resume previous medications. YOU HAD AN ENDOSCOPIC PROCEDURE TODAY AT THE [facility]: Refer to the procedure report that was given to you for any specific questions about what was found during the examination.  If the procedure report does not answer your questions, please call your gastroenterologist to clarify.  If you requested that your care partner not be given the details of your procedure findings, then the procedure report has been included in a sealed envelope for you to review at your convenience later.  YOU SHOULD EXPECT: Some feelings of bloating in the abdomen. Passage of more gas than usual.  Walking can help get rid of the air that was put into your GI tract during the procedure and reduce the bloating. If you had a lower endoscopy (such as a colonoscopy or flexible sigmoidoscopy) you may notice spotting of blood in your stool or on the toilet paper. If you underwent a bowel prep for your procedure, then you may not have a normal bowel movement for a few days.  DIET: Your first meal following the procedure should be a light meal and then it is ok to progress to your normal diet.  A half-sandwich or bowl of soup is an example of a good first meal.  Heavy or fried foods are harder to digest and may make you feel nauseous or bloated.  Likewise meals heavy in dairy and vegetables can cause extra gas to form and this can also increase the bloating.  Drink plenty of fluids but you should avoid alcoholic beverages for 24 hours.  ACTIVITY: Your care partner should take you home directly after the procedure.  You should plan to take it easy, moving slowly for the rest of the day.  You can resume normal activity the day after the procedure however you should NOT DRIVE or use heavy machinery for 24 hours (because of the sedation medicines used during the test).    SYMPTOMS TO REPORT IMMEDIATELY: A gastroenterologist  can be reached at any hour.  During normal business hours, 8:30 AM to 5:00 PM Monday through Friday, call [phone].  After hours and on weekends, please call the GI answering service at [phone] who will take a message and have the physician on call contact you.   Following lower endoscopy (colonoscopy or flexible sigmoidoscopy):  Excessive amounts of blood in the stool  Significant tenderness or worsening of abdominal pains  Swelling of the abdomen that is new, acute  Fever of 100F or higher  FOLLOW UP: If any biopsies were taken you will be contacted by phone or by letter within the next 1-3 weeks.  Call your gastroenterologist if you have not heard about the biopsies in 3 weeks.  Our staff will call the home number listed on your records the next business day following your procedure to check on you and address any questions or concerns that you may have at that time regarding the information given to you following your procedure. This is a courtesy call and so if there is no answer at the home number and we have not heard from you through the emergency physician on call, we will assume that you have returned to your regular daily activities without incident.  SIGNATURES/CONFIDENTIALITY: You and/or your care partner have signed paperwork which will be entered into your electronic medical record.  These signatures attest to the fact that that the information above on your After Visit Summary has been reviewed   and is understood.  Full responsibility of the confidentiality of this discharge information lies with you and/or your care-partner. 

## 2013-09-09 NOTE — Progress Notes (Signed)
A/ox3 pleased with MAC, report to Celia RN 

## 2013-09-09 NOTE — Op Note (Signed)
[facility] [address] [place], [address]   COLONOSCOPY PROCEDURE REPORT  PATIENT: [name] Conway, [name] Conway  MR#: [id number] BIRTHDATE: 19-Nov-1955 , 57  yrs. old GENDER: Female ENDOSCOPIST: Hart Carwin, MD REFERRED XB:JYNWGN Darrick Huntsman, M.D. PROCEDURE DATE:  09/09/2013 PROCEDURE:   Colonoscopy, screening First Screening Colonoscopy - Avg.  risk and is 50 yrs.  old or older - No.  Prior Negative Screening - Now for repeat screening. 10 or more years since last screening Prior Negative Screening - Now for repeat screening.  Above average risk  History of Adenoma - Now for follow-up colonoscopy & has been > or = to 3 yrs.  N/A Polyps Removed Today? No.  Recommend repeat exam, <10 yrs? Yes. High risk (family or personal hx). ASA CLASS:   Class II INDICATIONS:history of colon cancer in a parent.  Prior colonoscopy in 2004 and in November 2009. MEDICATIONS: MAC sedation, administered by CRNA and Propofol (Diprivan) 180 mg IV  DESCRIPTION OF PROCEDURE:   After the risks benefits and alternatives of the procedure were thoroughly explained, informed consent was obtained.  A digital rectal exam revealed no abnormalities of the rectum.   The LB [id number]-[id number]  endoscope was introduced through the anus and advanced to the cecum, which was identified by both the appendix and ileocecal valve. No adverse events experienced.   The quality of the prep was good, using MoviPrep  The instrument was then slowly withdrawn as the colon was fully examined.      COLON FINDINGS: A normal appearing cecum, ileocecal valve, and appendiceal orifice were identified.  The ascending, hepatic flexure, transverse, splenic flexure, descending, sigmoid colon and rectum appeared unremarkable.  No polyps or cancers were seen. Retroflexed views revealed no abnormalities. The time to cecum=5 minutes 27 seconds.  Withdrawal time=6 minutes 10 seconds.  The scope was withdrawn and the procedure  completed. COMPLICATIONS: There were no complications.  ENDOSCOPIC IMPRESSION: Normal colon  RECOMMENDATIONS: 1.  High fiber diet 2.  recall colonoscopy in 5 years   eSigned:  Hart Carwin, MD 09/09/2013 8:32 AM   cc:

## 2013-09-10 ENCOUNTER — Telehealth: Payer: Self-pay

## 2013-09-10 NOTE — Telephone Encounter (Signed)
Left message on answering machine. 

## 2013-10-13 ENCOUNTER — Telehealth: Payer: Self-pay | Admitting: Internal Medicine

## 2013-10-13 NOTE — Telephone Encounter (Signed)
The patient has a UTI and wants to come in to give a urine sample. Please advise . No appointments available.

## 2013-10-13 NOTE — Telephone Encounter (Signed)
Patient called urologist Nitrofurantoin prescribed by urologist

## 2013-11-11 ENCOUNTER — Ambulatory Visit (INDEPENDENT_AMBULATORY_CARE_PROVIDER_SITE_OTHER): Payer: BC Managed Care – PPO | Admitting: Internal Medicine

## 2013-11-11 ENCOUNTER — Encounter: Payer: Self-pay | Admitting: Internal Medicine

## 2013-11-11 VITALS — BP 124/68 | HR 82 | Temp 97.6°F | Resp 16 | Wt 116.0 lb

## 2013-11-11 DIAGNOSIS — N3090 Cystitis, unspecified without hematuria: Secondary | ICD-10-CM | POA: Insufficient documentation

## 2013-11-11 DIAGNOSIS — E785 Hyperlipidemia, unspecified: Secondary | ICD-10-CM

## 2013-11-11 DIAGNOSIS — N390 Urinary tract infection, site not specified: Secondary | ICD-10-CM

## 2013-11-11 LAB — POCT URINALYSIS DIPSTICK
Bilirubin, UA: NEGATIVE
Glucose, UA: NEGATIVE
Ketones, UA: NEGATIVE
Nitrite, UA: POSITIVE
PROTEIN UA: NEGATIVE
SPEC GRAV UA: 1.025
Urobilinogen, UA: 0.2
pH, UA: 6

## 2013-11-11 MED ORDER — CIPROFLOXACIN HCL 250 MG PO TABS: 250.0000 mg | ORAL_TABLET | Freq: Two times a day (BID) | ORAL | Status: DC

## 2013-11-11 NOTE — Assessment & Plan Note (Addendum)
Frequent ,  With recent 6 month prophylaxis ending in December followed by 2 UTIs in January,  No culture done, treated based on sympomts by her urologist. Empiric antibiotics prescribed today again.  Since she had a course of macrobid last month, less than 30 days ago,  I chose  Ciprofloxacin.  She is not interested in topical estrogen therapy,  Offered daily cranberry tablets as preventive.

## 2013-11-11 NOTE — Progress Notes (Signed)
Pre-visit discussion using our clinic review tool. No additional management support is needed unless otherwise documented below in the visit note.  

## 2013-11-11 NOTE — Progress Notes (Addendum)
Patient ID: [name] Conway, female   DOB: 06/23/1956, 58 y.o.   MRN: [id number]   Patient Active Problem List   Diagnosis Date Noted  . Recurrent cystitis 11/11/2013  . Vaginitis and vulvovaginitis 04/20/2013  . Urethral polyp 01/08/2013  . Osteoporosis/osteopenia increased risk 06/03/2012  . Abnormal EKG 12/12/2010  . PFO (patent foramen ovale) 12/12/2010  . ABDOMINAL PAIN, GENERALIZED 10/12/2010  . FLANK PAIN, LEFT 10/12/2010  . NEPHROLITHIASIS, HX OF 10/12/2010  . HYPERGLYCEMIA 09/28/2008  . UNSPECIFIED VITAMIN D DEFICIENCY 09/20/2008  . PANIC DISORDER 04/08/2008  . DIABETES MELLITUS, GESTATIONAL, HX OF 04/08/2008  . GERD 02/24/2008  . HYPERLIPIDEMIA 01/23/2007    Subjective:  CC:   Chief Complaint  Patient presents with  . Urinary Tract Infection     X 2 days frequency and burning    HPI:   [name] Canningeresa Lynn Ebert is a 58 y.o. female who presents for 2 day history of urinary frequency and burning. No recent travel or history of intercourse,  Does not douche.  No back pain, fevers or nausea.  Last use of abx was< 1 month ago for recurrent UTI  Had 2 in January  .   Past Medical History  Diagnosis Date  . Atrial septal aneurysm   . Diabetes mellitus     gestational diabetes//not taking any meds now  . Panic disorder   . GERD (gastroesophageal reflux disease)   . Family history of colorectal cancer   . Facial paresis   . Hyperlipidemia   . Osteopenia   . Hyperglycemia     Past Surgical History  Procedure Laterality Date  . Cesarean section  1990, 1991    x2  . Breast biopsy  1980, 1990    x3       The following portions of the patient's history were reviewed and updated as appropriate: Allergies, current medications, and problem list.    Review of Systems:   Patient denies headache, fevers, malaise, unintentional weight loss, skin rash, eye pain, sinus congestion and sinus pain, sore throat, dysphagia,  hemoptysis , cough, dyspnea, wheezing,  chest pain, palpitations, orthopnea, edema, abdominal pain, nausea, melena, diarrhea, constipation, flank pain, dysuria, hematuria, urinary  Frequency, nocturia, numbness, tingling, seizures,  Focal weakness, Loss of consciousness,  Tremor, insomnia, depression, anxiety, and suicidal ideation.     History   Social History  . Marital Status: Married    Spouse Name: N/A    Number of Children: N/A  . Years of Education: N/A   Occupational History  . Not on file.   Social History Main Topics  . Smoking status: Former Smoker -- 3 years    Types: Cigarettes    Quit date: 09/24/1976  . Smokeless tobacco: Never Used     Comment: high school  . Alcohol Use: Yes     Comment: occasional/ 1 glass wine a month  . Drug Use: No  . Sexual Activity: Not on file   Other Topics Concern  . Not on file   Social History Narrative  . No narrative on file    Objective:  Filed Vitals:   11/11/13 1614  BP: 124/68  Pulse: 82  Temp: 97.6 F (36.4 C)  Resp: 16     General appearance: alert, cooperative and appears stated age Ears: normal TM's and external ear canals both ears Throat: lips, mucosa, and tongue normal; teeth and gums normal Neck: no adenopathy, no carotid bruit, supple, symmetrical, trachea midline and thyroid not enlarged, symmetric, no tenderness/mass/nodules Back:  symmetric, no curvature. ROM normal. No CVA tenderness. Lungs: clear to auscultation bilaterally Heart: regular rate and rhythm, S1, S2 normal, no murmur, click, rub or gallop Abdomen: soft, non-tender; bowel sounds normal; no masses,  no organomegaly Pulses: 2+ and symmetric Skin: Skin color, texture, turgor normal. No rashes or lesions Lymph nodes: Cervical, supraclavicular, and axillary nodes normal.  Assessment and Plan:  Recurrent cystitis Frequent ,  With recent 6 month prophylaxis ending in December followed by 2 UTIs in January,  No culture done, treated based on sympomts by her urologist. Empiric  antibiotics prescribed today again.  Since she had a course of macrobid last month, less than 30 days ago,  I chose  Ciprofloxacin.  She is not interested in topical estrogen therapy,  Offered daily cranberry tablets as preventive.   HYPERLIPIDEMIA Reviewed prior fasting lipids again , By Nestor Lewandowsky criteria , no treatment needed.   A total of 25 minutes was spent with patient more than half of which was spent in counseling, reviewing records from other prviders and coordination of care.  Updated Medication List Outpatient Encounter Prescriptions as of 11/11/2013  Medication Sig  . aspirin 81 MG EC tablet Take 81 mg by mouth daily.    . cholecalciferol (VITAMIN D) 1000 UNITS tablet Take 1,000 Units by mouth daily.  . ciprofloxacin (CIPRO) 250 MG tablet Take 1 tablet (250 mg total) by mouth 2 (two) times daily.     Orders Placed This Encounter  Procedures  . Urine culture  . POCT urinalysis dipstick    No Follow-up on file.

## 2013-11-12 ENCOUNTER — Encounter: Payer: Self-pay | Admitting: Internal Medicine

## 2013-11-12 NOTE — Assessment & Plan Note (Signed)
Reviewed prior fasting lipids again , By [name] Conway criteria , no treatment needed.

## 2013-11-13 LAB — URINE CULTURE

## 2013-11-15 ENCOUNTER — Encounter: Payer: Self-pay | Admitting: Internal Medicine

## 2013-11-24 ENCOUNTER — Encounter: Payer: Self-pay | Admitting: Internal Medicine

## 2013-11-25 ENCOUNTER — Other Ambulatory Visit: Payer: Self-pay | Admitting: Internal Medicine

## 2013-11-25 DIAGNOSIS — R739 Hyperglycemia, unspecified: Secondary | ICD-10-CM

## 2013-11-25 DIAGNOSIS — E785 Hyperlipidemia, unspecified: Secondary | ICD-10-CM

## 2013-11-25 DIAGNOSIS — R5383 Other fatigue: Secondary | ICD-10-CM

## 2013-11-25 DIAGNOSIS — R5381 Other malaise: Secondary | ICD-10-CM

## 2013-12-08 ENCOUNTER — Other Ambulatory Visit: Payer: BC Managed Care – PPO

## 2013-12-17 ENCOUNTER — Other Ambulatory Visit (INDEPENDENT_AMBULATORY_CARE_PROVIDER_SITE_OTHER): Payer: BC Managed Care – PPO

## 2013-12-17 DIAGNOSIS — E785 Hyperlipidemia, unspecified: Secondary | ICD-10-CM

## 2013-12-17 DIAGNOSIS — R739 Hyperglycemia, unspecified: Secondary | ICD-10-CM

## 2013-12-17 DIAGNOSIS — R5383 Other fatigue: Secondary | ICD-10-CM

## 2013-12-17 DIAGNOSIS — R5381 Other malaise: Secondary | ICD-10-CM

## 2013-12-17 DIAGNOSIS — R7309 Other abnormal glucose: Secondary | ICD-10-CM

## 2013-12-17 LAB — CBC WITH DIFFERENTIAL/PLATELET
Basophils Absolute: 0 10*3/uL (ref 0.0–0.1)
Basophils Relative: 0.9 % (ref 0.0–3.0)
EOS PCT: 2.1 % (ref 0.0–5.0)
Eosinophils Absolute: 0.1 10*3/uL (ref 0.0–0.7)
HCT: 40 % (ref 36.0–46.0)
Hemoglobin: 13.4 g/dL (ref 12.0–15.0)
LYMPHS PCT: 34.6 % (ref 12.0–46.0)
Lymphs Abs: 1.2 10*3/uL (ref 0.7–4.0)
MCHC: 33.4 g/dL (ref 30.0–36.0)
MCV: 94.7 fl (ref 78.0–100.0)
MONO ABS: 0.3 10*3/uL (ref 0.1–1.0)
MONOS PCT: 9.7 % (ref 3.0–12.0)
Neutro Abs: 1.8 10*3/uL (ref 1.4–7.7)
Neutrophils Relative %: 52.7 % (ref 43.0–77.0)
PLATELETS: 319 10*3/uL (ref 150.0–400.0)
RBC: 4.23 Mil/uL (ref 3.87–5.11)
RDW: 12.6 % (ref 11.5–14.6)
WBC: 3.3 10*3/uL — AB (ref 4.5–10.5)

## 2013-12-17 LAB — LIPID PANEL
CHOLESTEROL: 196 mg/dL (ref 0–200)
HDL: 53.7 mg/dL (ref >39.00)
LDL Cholesterol: 132 mg/dL — ABNORMAL HIGH (ref 0–99)
Total CHOL/HDL Ratio: 4
Triglycerides: 54 mg/dL (ref 0.0–149.0)
VLDL: 10.8 mg/dL (ref 0.0–40.0)

## 2013-12-17 LAB — COMPREHENSIVE METABOLIC PANEL
ALBUMIN: 4.2 g/dL (ref 3.5–5.2)
ALT: 17 U/L (ref 0–35)
AST: 21 U/L (ref 0–37)
Alkaline Phosphatase: 68 U/L (ref 39–117)
BUN: 21 mg/dL (ref 6–23)
CO2: 30 meq/L (ref 19–32)
Calcium: 9.6 mg/dL (ref 8.4–10.5)
Chloride: 104 mEq/L (ref 96–112)
Creatinine, Ser: 0.7 mg/dL (ref 0.4–1.2)
GFR: 98.74 mL/min (ref >60.00)
GLUCOSE: 102 mg/dL — AB (ref 70–99)
POTASSIUM: 4.8 meq/L (ref 3.5–5.1)
SODIUM: 139 meq/L (ref 135–145)
Total Bilirubin: 0.7 mg/dL (ref 0.3–1.2)
Total Protein: 6.3 g/dL (ref 6.0–8.3)

## 2013-12-17 LAB — HEMOGLOBIN A1C: Hgb A1c MFr Bld: 6.1 % (ref 4.6–6.5)

## 2013-12-17 LAB — TSH: TSH: 0.84 u[IU]/mL (ref 0.35–5.50)

## 2013-12-20 ENCOUNTER — Encounter: Payer: Self-pay | Admitting: Internal Medicine

## 2014-02-27 LAB — HM MAMMOGRAPHY: HM Mammogram: NEGATIVE

## 2014-07-09 ENCOUNTER — Other Ambulatory Visit: Payer: Self-pay

## 2014-07-13 ENCOUNTER — Telehealth: Payer: Self-pay | Admitting: *Deleted

## 2014-07-13 DIAGNOSIS — D72819 Decreased white blood cell count, unspecified: Secondary | ICD-10-CM

## 2014-07-13 DIAGNOSIS — Z1239 Encounter for other screening for malignant neoplasm of breast: Secondary | ICD-10-CM

## 2014-07-13 DIAGNOSIS — R5383 Other fatigue: Secondary | ICD-10-CM

## 2014-07-13 DIAGNOSIS — E785 Hyperlipidemia, unspecified: Secondary | ICD-10-CM

## 2014-07-13 DIAGNOSIS — R7301 Impaired fasting glucose: Secondary | ICD-10-CM

## 2014-07-13 NOTE — Telephone Encounter (Signed)
Patient had fasting labs in March will they be needed at visit?

## 2014-07-13 NOTE — Telephone Encounter (Signed)
Pt called stating she has a physical scheduled for 08/23/14, pt has questions about her lab work, pt said if she has to have labs done she would like to have them done on the same day of the appt because she is 2 hours away. Please advise

## 2014-07-14 NOTE — Telephone Encounter (Signed)
Sent mychart message

## 2014-07-14 NOTE — Telephone Encounter (Signed)
Fating labs ordered to be done same days as office visit.  Confirm that it is her annual exam and 30 minute minimum. ,  Patient isbalso overdue for mammogram , which i have ordered,

## 2014-07-14 NOTE — Addendum Note (Signed)
Addended by: Sherlene ShamsULLO, Zeba L on: 07/14/2014 01:08 PM   Modules accepted: Orders

## 2014-08-06 ENCOUNTER — Telehealth: Payer: Self-pay | Admitting: Internal Medicine

## 2014-08-06 NOTE — Telephone Encounter (Signed)
Ms. [name] Conway called saying she's found a lump in her groin area. She said in the past when she's had them she's been able to move them around, the one she has now is hard, tender to the touch, and unmovable. She's wondering if she can be seen sometime next week. I didn't see any appts available. Please call the patient. Pt ph# [phone] Thank you.

## 2014-08-06 NOTE — Telephone Encounter (Signed)
Appt scheduled 08/09/14

## 2014-08-09 ENCOUNTER — Ambulatory Visit (INDEPENDENT_AMBULATORY_CARE_PROVIDER_SITE_OTHER): Payer: BC Managed Care – PPO | Admitting: Internal Medicine

## 2014-08-09 ENCOUNTER — Telehealth: Payer: Self-pay | Admitting: Internal Medicine

## 2014-08-09 ENCOUNTER — Encounter: Payer: Self-pay | Admitting: Internal Medicine

## 2014-08-09 VITALS — BP 124/78 | HR 64 | Temp 97.4°F | Resp 16 | Ht 63.0 in | Wt 117.5 lb

## 2014-08-09 DIAGNOSIS — R5383 Other fatigue: Secondary | ICD-10-CM

## 2014-08-09 DIAGNOSIS — R590 Localized enlarged lymph nodes: Secondary | ICD-10-CM

## 2014-08-09 DIAGNOSIS — E785 Hyperlipidemia, unspecified: Secondary | ICD-10-CM

## 2014-08-09 DIAGNOSIS — Z79899 Other long term (current) drug therapy: Secondary | ICD-10-CM

## 2014-08-09 DIAGNOSIS — M8580 Other specified disorders of bone density and structure, unspecified site: Secondary | ICD-10-CM

## 2014-08-09 DIAGNOSIS — R140 Abdominal distension (gaseous): Secondary | ICD-10-CM | POA: Insufficient documentation

## 2014-08-09 LAB — COMPREHENSIVE METABOLIC PANEL
ALT: 14 U/L (ref 0–35)
AST: 24 U/L (ref 0–37)
Albumin: 4.3 g/dL (ref 3.5–5.2)
Alkaline Phosphatase: 61 U/L (ref 39–117)
BUN: 19 mg/dL (ref 6–23)
CALCIUM: 9.6 mg/dL (ref 8.4–10.5)
CHLORIDE: 108 meq/L (ref 96–112)
CO2: 23 meq/L (ref 19–32)
Creatinine, Ser: 0.7 mg/dL (ref 0.4–1.2)
GFR: 93.53 mL/min (ref >60.00)
Glucose, Bld: 102 mg/dL — ABNORMAL HIGH (ref 70–99)
Potassium: 4.8 mEq/L (ref 3.5–5.1)
SODIUM: 144 meq/L (ref 135–145)
Total Bilirubin: 0.7 mg/dL (ref 0.2–1.2)
Total Protein: 6.8 g/dL (ref 6.0–8.3)

## 2014-08-09 LAB — LIPID PANEL
CHOL/HDL RATIO: 4
Cholesterol: 211 mg/dL — ABNORMAL HIGH (ref 0–200)
HDL: 54.3 mg/dL (ref >39.00)
LDL CALC: 144 mg/dL — AB (ref 0–99)
NonHDL: 156.7
Triglycerides: 66 mg/dL (ref 0.0–149.0)
VLDL: 13.2 mg/dL (ref 0.0–40.0)

## 2014-08-09 LAB — CBC WITH DIFFERENTIAL/PLATELET
BASOS PCT: 0.8 % (ref 0.0–3.0)
Basophils Absolute: 0 10*3/uL (ref 0.0–0.1)
EOS ABS: 0.1 10*3/uL (ref 0.0–0.7)
Eosinophils Relative: 1.8 % (ref 0.0–5.0)
HCT: 42.7 % (ref 36.0–46.0)
HEMOGLOBIN: 13.9 g/dL (ref 12.0–15.0)
Lymphocytes Relative: 36.8 % (ref 12.0–46.0)
Lymphs Abs: 1.4 10*3/uL (ref 0.7–4.0)
MCHC: 32.7 g/dL (ref 30.0–36.0)
MCV: 93.6 fl (ref 78.0–100.0)
MONO ABS: 0.4 10*3/uL (ref 0.1–1.0)
Monocytes Relative: 10.4 % (ref 3.0–12.0)
NEUTROS ABS: 2 10*3/uL (ref 1.4–7.7)
NEUTROS PCT: 50.2 % (ref 43.0–77.0)
Platelets: 366 10*3/uL (ref 150.0–400.0)
RBC: 4.56 Mil/uL (ref 3.87–5.11)
RDW: 13.1 % (ref 11.5–15.5)
WBC: 3.9 10*3/uL — ABNORMAL LOW (ref 4.0–10.5)

## 2014-08-09 LAB — TSH: TSH: 1.11 u[IU]/mL (ref 0.35–4.50)

## 2014-08-09 LAB — VITAMIN D 25 HYDROXY (VIT D DEFICIENCY, FRACTURES): VITD: 50.51 ng/mL (ref 30.00–100.00)

## 2014-08-09 NOTE — Telephone Encounter (Signed)
Faxed new order

## 2014-08-09 NOTE — Telephone Encounter (Signed)
Amber called from US and stated if US is for palpable Lymphadenopathy and not the ovaries need new order. If you want the Ovaries they need another DX.   If you need both they need right sided non -vascular groin area.

## 2014-08-09 NOTE — Telephone Encounter (Signed)
reorderd pelvic ultrasound  Using bloating as a symptoms

## 2014-08-09 NOTE — Progress Notes (Signed)
Patient ID: [name] Conway, female   DOB: 09/17/1956, 58 y.o.   MRN: [id number]   Patient Active Problem List   Diagnosis Date Noted  . Abdominal bloating 08/09/2014  . Recurrent cystitis 11/11/2013  . Vaginitis and vulvovaginitis 04/20/2013  . Urethral polyp 01/08/2013  . Osteoporosis/osteopenia increased risk 06/03/2012  . Abnormal EKG 12/12/2010  . PFO (patent foramen ovale) 12/12/2010  . ABDOMINAL PAIN, GENERALIZED 10/12/2010  . FLANK PAIN, LEFT 10/12/2010  . NEPHROLITHIASIS, HX OF 10/12/2010  . HYPERGLYCEMIA 09/28/2008  . UNSPECIFIED VITAMIN D DEFICIENCY 09/20/2008  . PANIC DISORDER 04/08/2008  . GERD 02/24/2008  . HYPERLIPIDEMIA 01/23/2007    Subjective:  CC:   Chief Complaint  Patient presents with  . Acute Visit    Knot in right groin area hurts if pressed on been there past htree to four days.    HPI:   [name] Conway is a 58 y.o. female who presents for  1 week history enlarged lymph node in right groin. Patient is on chronic suppressive doses of macrobid for recurrent UTI and denies any recent symptoms of dysuria, vaginal discharge or diarrhea.  No fevers,  Malaise or nausea.  History of atrophic vaginitis,  Refuses topical estrogen,  Uses [place], byt has not been sexually active recently,  Married.  Does not some mild pelvic bloating,  Is post manopausal and still has ovaries.    Past Medical History  Diagnosis Date  . Atrial septal aneurysm   . Diabetes mellitus     gestational diabetes//not taking any meds now  . Panic disorder   . GERD (gastroesophageal reflux disease)   . Family history of colorectal cancer   . Facial paresis   . Hyperlipidemia   . Osteopenia   . Hyperglycemia     Past Surgical History  Procedure Laterality Date  . Cesarean section  1990, 1991    x2  . Breast biopsy  1980, 1990    x3       The following portions of the patient's history were reviewed and updated as appropriate: Allergies, current medications,  and problem list.    Review of Systems:   Patient denies headache, fevers, malaise, unintentional weight loss, skin rash, eye pain, sinus congestion and sinus pain, sore throat, dysphagia,  hemoptysis , cough, dyspnea, wheezing, chest pain, palpitations, orthopnea, edema, abdominal pain, nausea, melena, diarrhea, constipation, flank pain, dysuria, hematuria, urinary  Frequency, nocturia, numbness, tingling, seizures,  Focal weakness, Loss of consciousness,  Tremor, insomnia, depression, anxiety, and suicidal ideation.     History   Social History  . Marital Status: Married    Spouse Name: N/A    Number of Children: N/A  . Years of Education: N/A   Occupational History  . Not on file.   Social History Main Topics  . Smoking status: Former Smoker -- 3 years    Types: Cigarettes    Quit date: 09/24/1976  . Smokeless tobacco: Never Used     Comment: high school  . Alcohol Use: Yes     Comment: occasional/ 1 glass wine a month  . Drug Use: No  . Sexual Activity: Not on file   Other Topics Concern  . Not on file   Social History Narrative    Objective:  Filed Vitals:   08/09/14 0805  BP: 124/78  Pulse: 64  Temp: 97.4 F (36.3 C)  Resp: 16     General appearance: alert, cooperative and appears stated age Ears: normal TM's and external ear canals  both ears Throat: lips, mucosa, and tongue normal; teeth and gums normal Neck: no adenopathy, no carotid bruit, supple, symmetrical, trachea midline and thyroid not enlarged, symmetric, no tenderness/mass/nodules Back: symmetric, no curvature. ROM normal. No CVA tenderness. Lungs: clear to auscultation bilaterally Heart: regular rate and rhythm, S1, S2 normal, no murmur, click, rub or gallop Abdomen: soft, non-tender; bowel sounds normal; no masses,  no organomegaly Pulses: 2+ and symmetric Skin: Skin color, texture, turgor normal. No rashes or lesions Lymph nodes: Cervical, supraclavicular, and axillary nodes normal. Right  inguinal node enlarged to 1.5 cm, slightly tender to palpation.   Assessment and Plan:  Abdominal bloating Present for several weeks,  Now with palpably enlarged right inguinal LN.  She is asymptomatic otherwsie and on chronic antibiotics so checking a UA would not change mananagement.  Checking CA 127 and pelvic ultrasound to rule out ovarian CA    Updated Medication List Outpatient Encounter Prescriptions as of 08/09/2014  Medication Sig  . aspirin 81 MG EC tablet Take 81 mg by mouth daily.    . cholecalciferol (VITAMIN D) 1000 UNITS tablet Take 1,000 Units by mouth daily.  . ciprofloxacin (CIPRO) 250 MG tablet Take 1 tablet (250 mg total) by mouth 2 (two) times daily.  . nitrofurantoin (MACRODANTIN) 100 MG capsule Take 100 mg by mouth daily.     Orders Placed This Encounter  Procedures  . US Transvaginal Non-OB  . CA 125  . CBC with Differential  . Comprehensive metabolic panel  . TSH  . Lipid panel  . Vit D  25 hydroxy (rtn osteoporosis monitoring)    No Follow-up on file.

## 2014-08-09 NOTE — Progress Notes (Signed)
Pre-visit discussion using our clinic review tool. No additional management support is needed unless otherwise documented below in the visit note.  

## 2014-08-10 ENCOUNTER — Ambulatory Visit: Payer: Self-pay | Admitting: Internal Medicine

## 2014-08-10 LAB — CA 125: CA 125: 6 U/mL (ref <35)

## 2014-08-10 NOTE — Assessment & Plan Note (Signed)
Present for several weeks,  Now with palpably enlarged right inguinal LN.  She is asymptomatic otherwsie and on chronic antibiotics so checking a UA would not change mananagement.  Checking CA 127 and pelvic ultrasound to rule out ovarian CA

## 2014-08-12 ENCOUNTER — Telehealth: Payer: Self-pay | Admitting: Internal Medicine

## 2014-08-12 DIAGNOSIS — R590 Localized enlarged lymph nodes: Secondary | ICD-10-CM

## 2014-08-12 NOTE — Telephone Encounter (Signed)
Message sent to ms strausaugh via mychart:   Good news.  The ultrasound showed only a simple cyst on the right ovary, and the CA 125 was also normal.  So there is no evidence of ovarian CA by the workup  The lymph node in your right groin may be reacting to another UTI.  IF you would like to come by to drop off a urine specimen,  I'll rule out another infection.  \Regards,  Dr. Darrick Huntsmanullo

## 2014-08-12 NOTE — Telephone Encounter (Signed)
Patient stated that if she felt she need to she would have the Urine checked in [place] because [place] already went back [place] .

## 2014-08-23 ENCOUNTER — Other Ambulatory Visit: Payer: BC Managed Care – PPO

## 2014-08-23 ENCOUNTER — Encounter: Payer: BC Managed Care – PPO | Admitting: Internal Medicine

## 2014-09-01 ENCOUNTER — Encounter: Payer: Self-pay | Admitting: Internal Medicine

## 2014-09-01 ENCOUNTER — Encounter: Payer: Self-pay | Admitting: *Deleted

## 2014-09-02 ENCOUNTER — Encounter: Payer: Self-pay | Admitting: Internal Medicine

## 2014-10-01 ENCOUNTER — Encounter: Payer: BC Managed Care – PPO | Admitting: Internal Medicine

## 2014-10-27 ENCOUNTER — Encounter: Payer: Self-pay | Admitting: Internal Medicine

## 2014-10-27 ENCOUNTER — Ambulatory Visit (INDEPENDENT_AMBULATORY_CARE_PROVIDER_SITE_OTHER): Payer: BLUE CROSS/BLUE SHIELD | Admitting: Internal Medicine

## 2014-10-27 VITALS — BP 106/68 | HR 71 | Temp 97.8°F | Resp 16 | Ht 64.0 in | Wt 116.5 lb

## 2014-10-27 DIAGNOSIS — R140 Abdominal distension (gaseous): Secondary | ICD-10-CM

## 2014-10-27 DIAGNOSIS — Z87898 Personal history of other specified conditions: Secondary | ICD-10-CM

## 2014-10-27 DIAGNOSIS — E785 Hyperlipidemia, unspecified: Secondary | ICD-10-CM

## 2014-10-27 DIAGNOSIS — Z9189 Other specified personal risk factors, not elsewhere classified: Secondary | ICD-10-CM

## 2014-10-27 DIAGNOSIS — Z0000 Encounter for general adult medical examination without abnormal findings: Secondary | ICD-10-CM

## 2014-10-27 DIAGNOSIS — Z8632 Personal history of gestational diabetes: Secondary | ICD-10-CM

## 2014-10-27 MED ORDER — ACYCLOVIR 400 MG PO TABS: 400.0000 mg | ORAL_TABLET | Freq: Every day | ORAL | Status: AC

## 2014-10-27 NOTE — Progress Notes (Signed)
Patient ID: [name] Canningeresa Lynn Manfredo, female   DOB: 03/22/1956, 59 y.o.   MRN: [id number]   Subjective:     [name] Conway is a 59 y.o. female and is here for a comprehensive physical exam. The patient reports no problems.  History   Social History  . Marital Status: Married    Spouse Name: N/A    Number of Children: N/A  . Years of Education: N/A   Occupational History  . Not on file.   Social History Main Topics  . Smoking status: Former Smoker -- 3 years    Types: Cigarettes    Quit date: 09/24/1976  . Smokeless tobacco: Never Used     Comment: high school  . Alcohol Use: Yes     Comment: occasional/ 1 glass wine a month  . Drug Use: No  . Sexual Activity: Not on file   Other Topics Concern  . Not on file   Social History Narrative   Health Maintenance  Topic Date Due  . PNEUMOCOCCAL POLYSACCHARIDE VACCINE (1) 09/25/1955  . FOOT EXAM  09/25/1963  . OPHTHALMOLOGY EXAM  09/25/1963  . URINE MICROALBUMIN  12/19/2012  . PAP SMEAR  12/23/2012  . ZOSTAVAX  09/24/2013  . HEMOGLOBIN A1C  06/19/2014  . INFLUENZA VACCINE  04/25/2015  . MAMMOGRAM  02/28/2016  . COLONOSCOPY  09/09/2018  . TETANUS/TDAP  06/25/2022    The following portions of the patient's history were reviewed and updated as appropriate: allergies, current medications, past family history, past medical history, past social history, past surgical history and problem list.  Review of Systems A comprehensive review of systems was negative.  Patient denies headache, fevers, malaise, unintentional weight loss, skin rash, eye pain, sinus congestion and sinus pain, sore throat, dysphagia,  hemoptysis , cough, dyspnea, wheezing, chest pain, palpitations, orthopnea, edema, abdominal pain, nausea, melena, diarrhea, constipation, flank pain, dysuria, hematuria, urinary  Frequency, nocturia, numbness, tingling, seizures,  Focal weakness, Loss of consciousness,  Tremor, insomnia, depression, anxiety, and suicidal  ideation.     Objective:   BP 106/68 mmHg  Pulse 71  Temp(Src) 97.8 F (36.6 C) (Oral)  Resp 16  Ht 5\' 4"  (1.626 m)  Wt 116 lb 8 oz (52.844 kg)  BMI 19.99 kg/m2  SpO2 98%  General appearance: alert, cooperative and appears stated age Head: Normocephalic, without obvious abnormality, atraumatic Eyes: conjunctivae/corneas clear. PERRL, EOM's intact. Fundi benign. Ears: normal TM's and external ear canals both ears Nose: Nares normal. Septum midline. Mucosa normal. No drainage or sinus tenderness. Throat: lips, mucosa, and tongue normal; teeth and gums normal Neck: no adenopathy, no carotid bruit, no JVD, supple, symmetrical, trachea midline and thyroid not enlarged, symmetric, no tenderness/mass/nodules Lungs: clear to auscultation bilaterally Breasts: normal appearance, no masses or tenderness Heart: regular rate and rhythm, S1, S2 normal, no murmur, click, rub or gallop Abdomen: soft, non-tender; bowel sounds normal; no masses,  no organomegaly Extremities: extremities normal, atraumatic, no cyanosis or edema Pulses: 2+ and symmetric Skin: Skin color, texture, turgor normal. No rashes or lesions Neurologic: Alert and oriented X 3, normal strength and tone. Normal symmetric reflexes. Normal coordination and gait.    Assessment and Plan:   Problem List Items Addressed This Visit    Abdominal bloating    She underwent CA 127 and pelvic ultrasound due to concerns for ovarian ca.  Both were normal.  The right inguinal LAD has resolved.        Encounter for preventive health examination - Primary  Hx gestational diabetes    She continues to have A1cs checked annual and all have been < 6.5  Her fasting glucoses have been < 110 .        Hyperlipidemia    Fasting lipids from November were reviewed using the Framingham criteria , and no treatment is needed.   Lab Results  Component Value Date   CHOL 211* 08/09/2014   HDL 54.30 08/09/2014   LDLCALC 144* 08/09/2014    LDLDIRECT 138.7 12/20/2011   TRIG 66.0 08/09/2014   CHOLHDL 4 08/09/2014         Osteoporosis/osteopenia increased risk    Bone Density scores from 2013 reviewed .She continues to defer treatment of osteopenia.  Would repeat in 2 years and consider therapy then if there is a significant change. Continue calcium, vitamin d and weight bearing exercise on a regular basis. .  Recommended to add more weight bearing exercise to regimen

## 2014-10-27 NOTE — Progress Notes (Signed)
Pre-visit discussion using our clinic review tool. No additional management support is needed unless otherwise documented below in the visit note.  

## 2014-10-27 NOTE — Patient Instructions (Signed)

## 2014-10-28 DIAGNOSIS — Z0000 Encounter for general adult medical examination without abnormal findings: Secondary | ICD-10-CM | POA: Insufficient documentation

## 2014-10-28 NOTE — Assessment & Plan Note (Signed)
Bone Density scores from 2013 reviewed .She continues to defer treatment of osteopenia.  Would repeat in 2 years and consider therapy then if there is a significant change. Continue calcium, vitamin d and weight bearing exercise on a regular basis. .  Recommended to add more weight bearing exercise to regimen

## 2014-10-28 NOTE — Assessment & Plan Note (Signed)
Fasting lipids from November were reviewed using the Framingham criteria , and no treatment is needed.   Lab Results  Component Value Date   CHOL 211* 08/09/2014   HDL 54.30 08/09/2014   LDLCALC 144* 08/09/2014   LDLDIRECT 138.7 12/20/2011   TRIG 66.0 08/09/2014   CHOLHDL 4 08/09/2014

## 2014-10-28 NOTE — Assessment & Plan Note (Signed)
She continues to have A1cs checked annual and all have been < 6.5  Her fasting glucoses have been < 110 .

## 2014-10-28 NOTE — Assessment & Plan Note (Signed)
She underwent CA 127 and pelvic ultrasound due to concerns for ovarian ca.  Both were normal.  The right inguinal LAD has resolved.

## 2015-03-21 ENCOUNTER — Other Ambulatory Visit: Payer: Self-pay

## 2015-07-18 ENCOUNTER — Other Ambulatory Visit: Payer: Self-pay | Admitting: Internal Medicine

## 2015-07-18 MED ORDER — ESTRADIOL 0.1 MG/GM VA CREA: 1.0000 g | TOPICAL_CREAM | Freq: Every day | VAGINAL | Status: AC

## 2016-01-03 ENCOUNTER — Encounter: Payer: Self-pay | Admitting: Internal Medicine

## 2016-04-22 IMAGING — US US PELV - US TRANSVAGINAL
1 series · 14 of 25 positions shown · non-contrast
Comparison: None

CLINICAL DATA: Postmenopausal bloating

EXAM:
TRANSABDOMINAL AND TRANSVAGINAL ULTRASOUND OF PELVIS
TECHNIQUE: Both transabdominal and transvaginal ultrasound examinations of the
pelvis were performed. Transabdominal technique was performed for
global imaging of the pelvis including uterus, ovaries, adnexal
regions, and pelvic cul-de-sac. It was necessary to proceed with
endovaginal exam following the transabdominal exam to visualize the
endometrium and left adnexa.

[Series 1: us pelv - us transvaginal · 0.21mm/px · 14 of 46 slices shown]
[im 1/46]
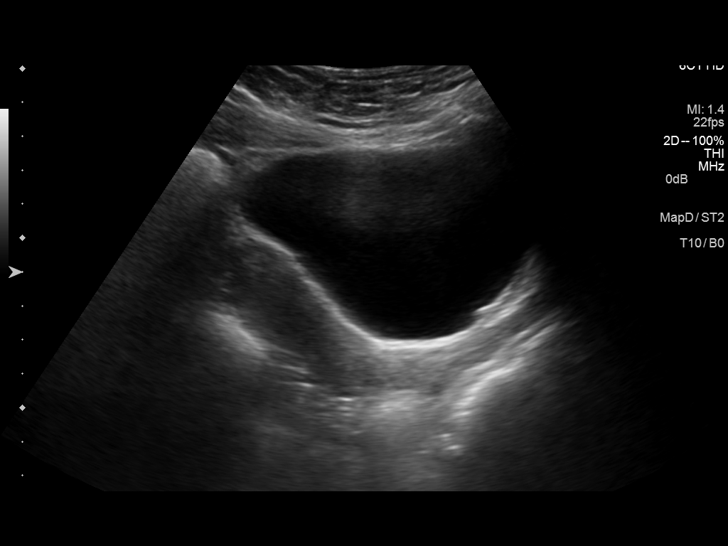
[im 4/46]
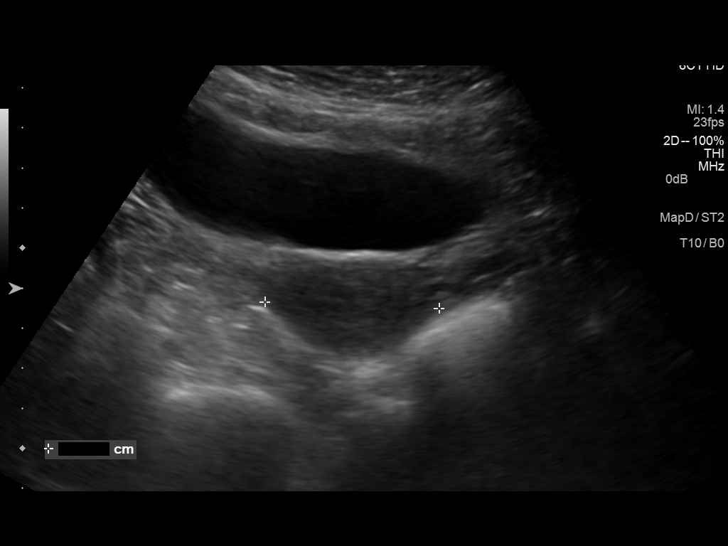
[im 8/46]
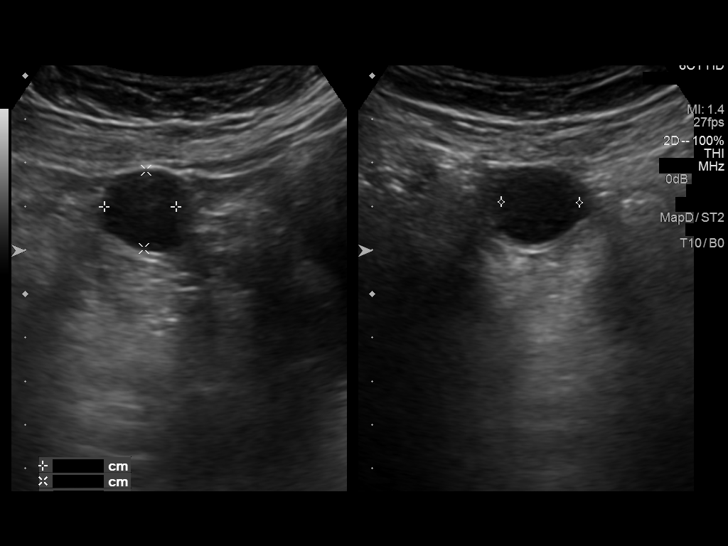
[im 12/46]
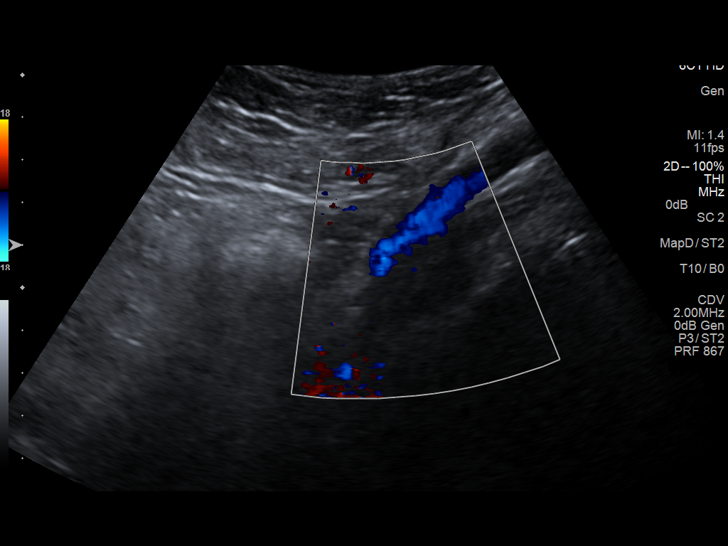
[im 16/46]
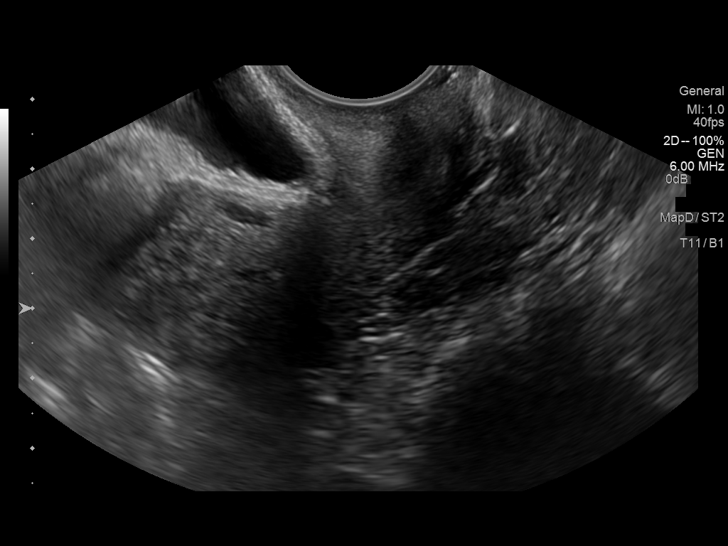
[im 17/46]
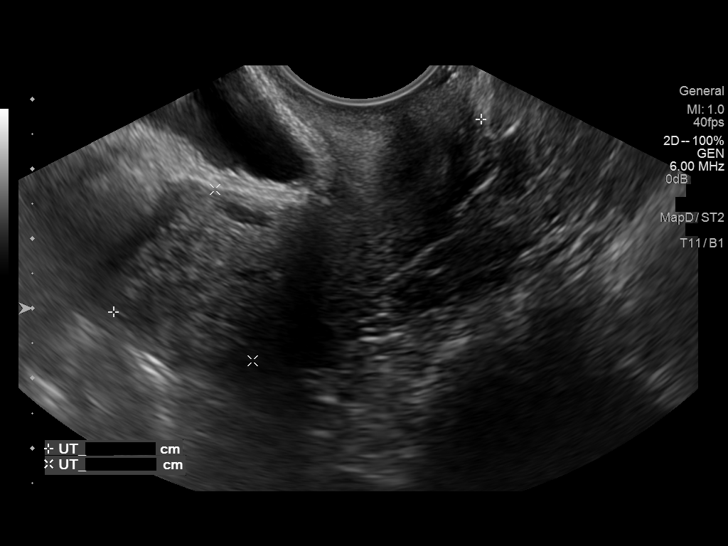
[im 21/46]
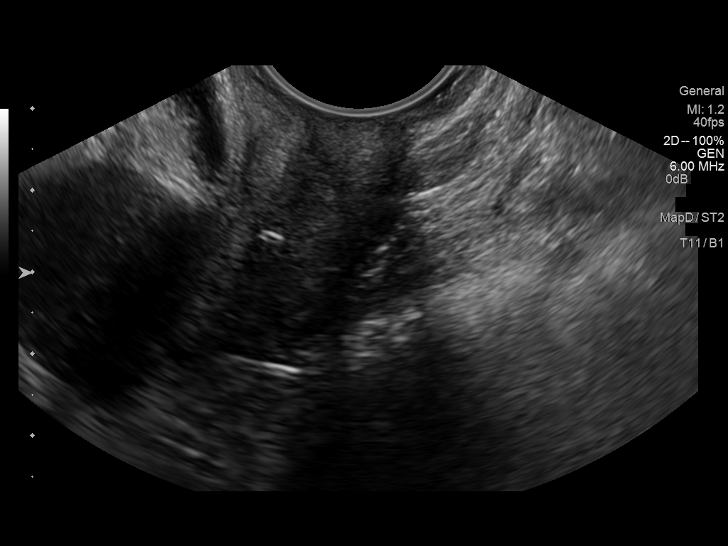
[im 25/46]
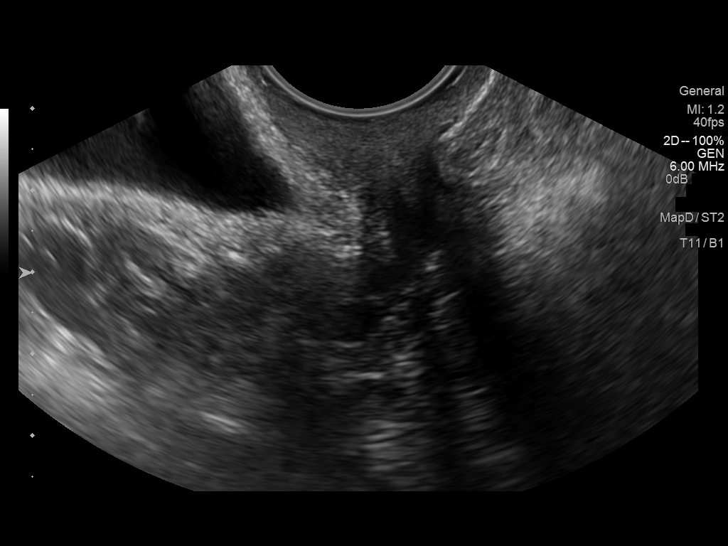
[im 29/46]
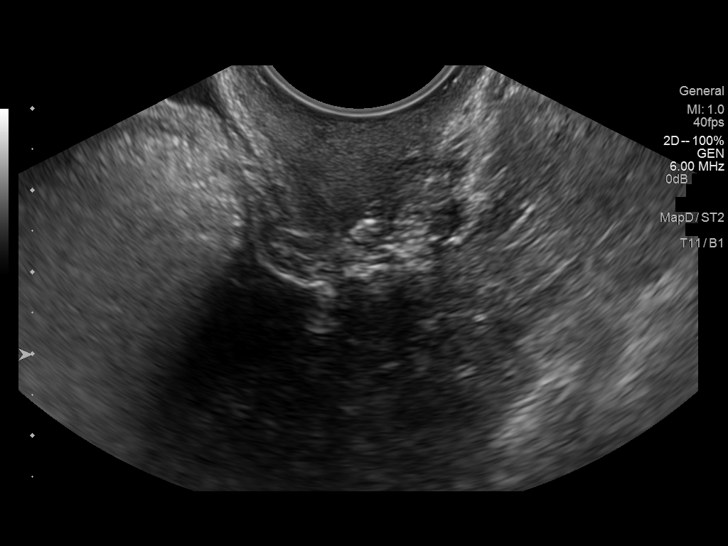
[im 31/46]
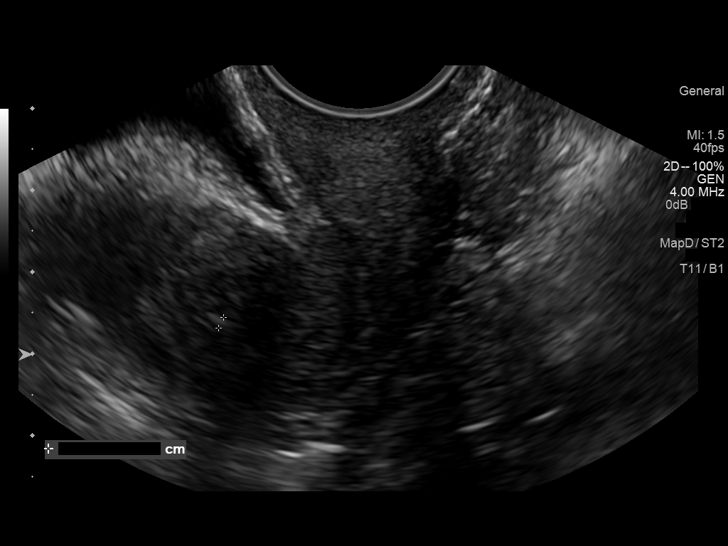
[im 34/46]
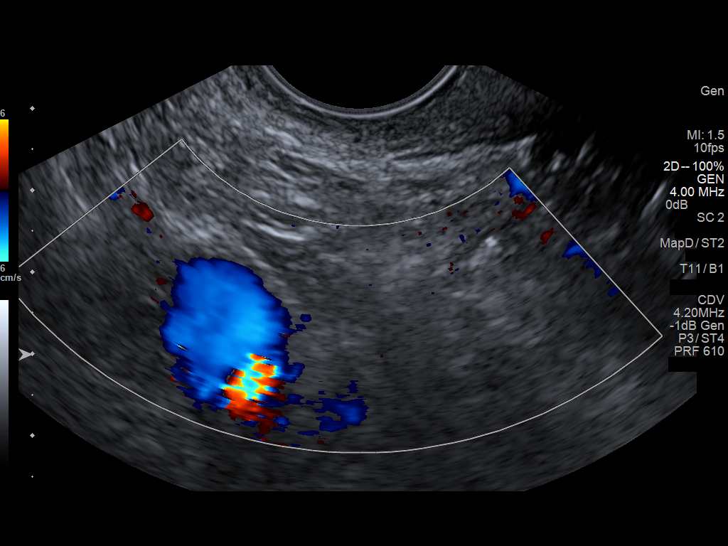
[im 38/46]
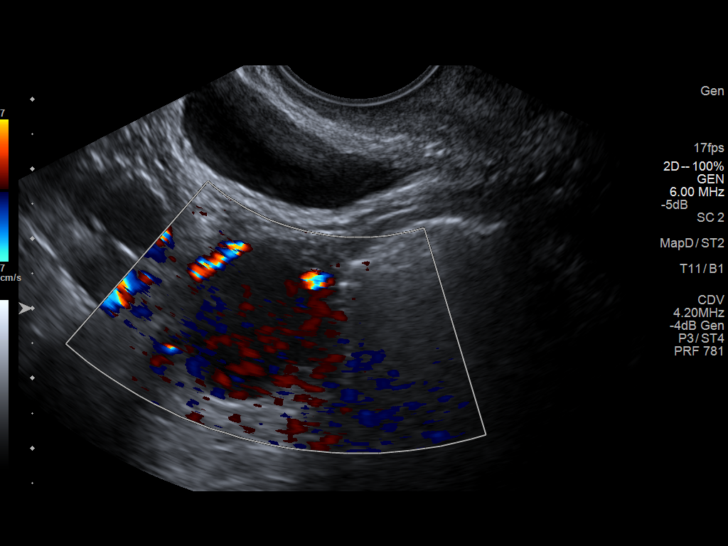
[im 42/46]
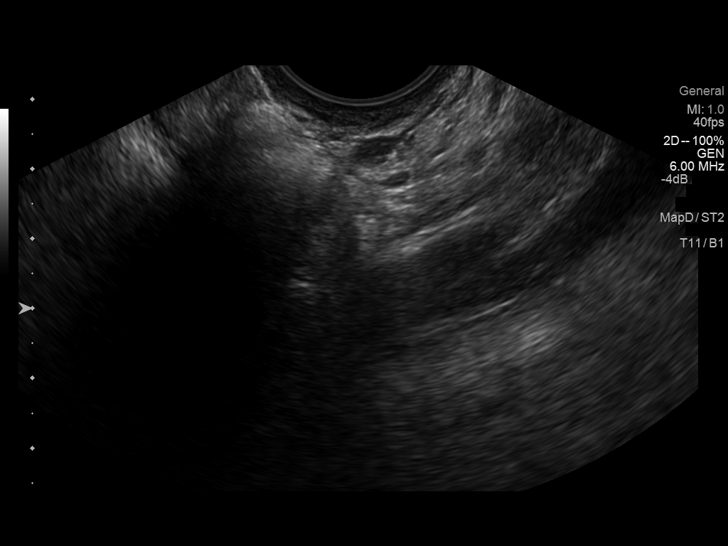
[im 46/46]
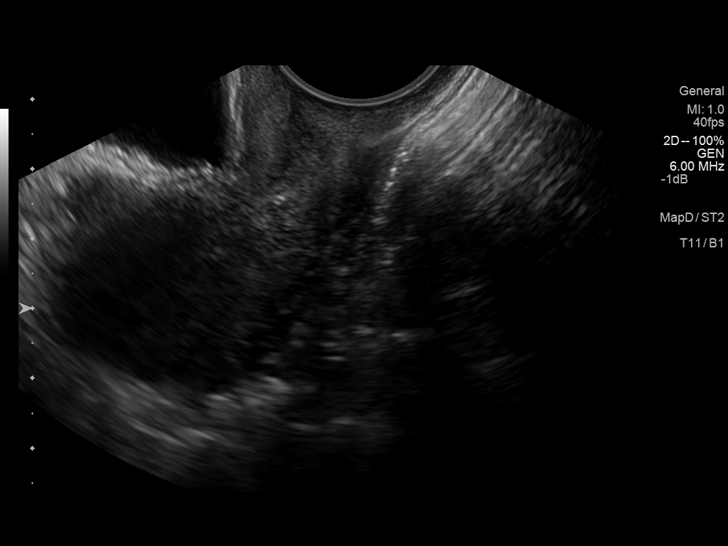

[14 of 25 positions shown; findings below may reference images not displayed]

FINDINGS: Uterus

Measurements: 5.9 x 2.5 x 3.8 cm. No fibroids or other mass
visualized.

Endometrium

Thickness: 2 mm.  No focal abnormality visualized.

Right ovary

Measurements: 3.1 x 2.1 x 2.5 cm. Dominant 1.6 x 1.8 x 1.8 cm
cyst/follicle.

Left ovary

Not discretely visualized.

Other findings

No free fluid.
IMPRESSION: 1.8 cm right ovarian cyst/ follicle, simple.

Left ovary is not discretely visualized.
